# Patient Record
Sex: Male | Born: 1955 | Race: Black or African American | Hispanic: No | Marital: Married | Smoking: Former smoker
Health system: Southern US, Community
[De-identification: ages and names within clinical notes are randomized; demographics above are authoritative.]

## PROBLEM LIST (undated history)

## (undated) DIAGNOSIS — E119 Type 2 diabetes mellitus without complications: Secondary | ICD-10-CM

## (undated) DIAGNOSIS — E7800 Pure hypercholesterolemia, unspecified: Secondary | ICD-10-CM

## (undated) DIAGNOSIS — M1990 Unspecified osteoarthritis, unspecified site: Secondary | ICD-10-CM

## (undated) DIAGNOSIS — R252 Cramp and spasm: Secondary | ICD-10-CM

## (undated) DIAGNOSIS — I5189 Other ill-defined heart diseases: Secondary | ICD-10-CM

## (undated) HISTORY — PX: FRACTURE SURGERY: SHX138

## (undated) HISTORY — PX: HERNIA REPAIR: SHX51

## (undated) HISTORY — DX: Other ill-defined heart diseases: I51.89

## (undated) HISTORY — PX: JOINT REPLACEMENT: SHX530

---

## 2007-05-31 ENCOUNTER — Ambulatory Visit: Payer: Self-pay | Admitting: General Surgery

## 2008-03-16 ENCOUNTER — Ambulatory Visit: Payer: Self-pay

## 2008-03-20 ENCOUNTER — Ambulatory Visit: Payer: Self-pay

## 2009-06-12 ENCOUNTER — Ambulatory Visit: Payer: Self-pay | Admitting: Unknown Physician Specialty

## 2009-06-19 ENCOUNTER — Inpatient Hospital Stay: Payer: Self-pay | Admitting: Unknown Physician Specialty

## 2009-07-11 ENCOUNTER — Encounter: Payer: Self-pay | Admitting: Unknown Physician Specialty

## 2009-07-16 ENCOUNTER — Encounter: Payer: Self-pay | Admitting: Unknown Physician Specialty

## 2010-01-28 ENCOUNTER — Emergency Department: Payer: Self-pay | Admitting: Emergency Medicine

## 2010-08-06 ENCOUNTER — Emergency Department: Payer: Self-pay

## 2015-04-15 ENCOUNTER — Emergency Department
Admission: EM | Admit: 2015-04-15 | Discharge: 2015-04-15 | Disposition: A | Discharge status: Home / Self Care | Payer: 59 | Attending: Emergency Medicine | Admitting: Emergency Medicine

## 2015-04-15 ENCOUNTER — Encounter: Payer: Self-pay | Admitting: Emergency Medicine

## 2015-04-15 DIAGNOSIS — Y998 Other external cause status: Secondary | ICD-10-CM | POA: Insufficient documentation

## 2015-04-15 DIAGNOSIS — W228XXA Striking against or struck by other objects, initial encounter: Secondary | ICD-10-CM | POA: Diagnosis not present

## 2015-04-15 DIAGNOSIS — Y9289 Other specified places as the place of occurrence of the external cause: Secondary | ICD-10-CM | POA: Insufficient documentation

## 2015-04-15 DIAGNOSIS — Y9301 Activity, walking, marching and hiking: Secondary | ICD-10-CM | POA: Insufficient documentation

## 2015-04-15 DIAGNOSIS — S0501XA Injury of conjunctiva and corneal abrasion without foreign body, right eye, initial encounter: Secondary | ICD-10-CM | POA: Diagnosis not present

## 2015-04-15 DIAGNOSIS — S0591XA Unspecified injury of right eye and orbit, initial encounter: Secondary | ICD-10-CM | POA: Diagnosis present

## 2015-04-15 HISTORY — DX: Pure hypercholesterolemia, unspecified: E78.00

## 2015-04-15 MED ORDER — EYE WASH OPHTH SOLN
OPHTHALMIC | Status: AC
  Filled 2015-04-15: qty 118

## 2015-04-15 MED ORDER — HYDROCODONE-ACETAMINOPHEN 5-325 MG PO TABS
1.0000 | ORAL_TABLET | Freq: Once | ORAL | Status: AC
  Administered 2015-04-15: 1 via ORAL
  Filled 2015-04-15: qty 1

## 2015-04-15 MED ORDER — FLUORESCEIN SODIUM 1 MG OP STRP
ORAL_STRIP | OPHTHALMIC | Status: AC
  Filled 2015-04-15: qty 1

## 2015-04-15 MED ORDER — TETRACAINE HCL 0.5 % OP SOLN
OPHTHALMIC | Status: AC
  Filled 2015-04-15: qty 2

## 2015-04-15 MED ORDER — SULFACETAMIDE SODIUM 10 % OP SOLN: 2.0000 [drp] | Freq: Four times a day (QID) | OPHTHALMIC | Status: DC

## 2015-04-15 MED ORDER — HYDROCODONE-ACETAMINOPHEN 5-325 MG PO TABS: 1.0000 | ORAL_TABLET | ORAL | Status: DC | PRN

## 2015-04-15 NOTE — Discharge Instructions (Signed)
Corneal Abrasion °The cornea is the clear covering at the front and center of the eye. When looking at the colored portion of the eye (iris), you are looking through the cornea. This very thin tissue is made up of many layers. The surface layer is a single layer of cells (corneal epithelium) and is one of the most sensitive tissues in the body. If a scratch or injury causes the corneal epithelium to come off, it is called a corneal abrasion. If the injury extends to the tissues below the epithelium, the condition is called a corneal ulcer. °CAUSES  °· Scratches. °· Trauma. °· Foreign body in the eye. °Some people have recurrences of abrasions in the area of the original injury even after it has healed (recurrent erosion syndrome). Recurrent erosion syndrome generally improves and goes away with time. °SYMPTOMS  °· Eye pain. °· Difficulty or inability to keep the injured eye open. °· The eye becomes very sensitive to light. °· Recurrent erosions tend to happen suddenly, first thing in the morning, usually after waking up and opening the eye. °DIAGNOSIS  °Your health care provider can diagnose a corneal abrasion during an eye exam. Dye is usually placed in the eye using a drop or a small paper strip moistened by your tears. When the eye is examined with a special light, the abrasion shows up clearly because of the dye. °TREATMENT  °· Small abrasions may be treated with antibiotic drops or ointment alone. °· A pressure patch may be put over the eye. If this is done, follow your doctor's instructions for when to remove the patch. Do not drive or use machines while the eye patch is on. Judging distances is hard to do with a patch on. °If the abrasion becomes infected and spreads to the deeper tissues of the cornea, a corneal ulcer can result. This is serious because it can cause corneal scarring. Corneal scars interfere with light passing through the cornea and cause a loss of vision in the involved eye. °HOME CARE  INSTRUCTIONS °· Use medicine or ointment as directed. Only take over-the-counter or prescription medicines for pain, discomfort, or fever as directed by your health care provider. °· Do not drive or operate machinery if your eye is patched. Your ability to judge distances is impaired. °· If your health care provider has given you a follow-up appointment, it is very important to keep that appointment. Not keeping the appointment could result in a severe eye infection or permanent loss of vision. If there is any problem keeping the appointment, let your health care provider know. °SEEK MEDICAL CARE IF:  °· You have pain, light sensitivity, and a scratchy feeling in one eye or both eyes. °· Your pressure patch keeps loosening up, and you can blink your eye under the patch after treatment. °· Any kind of discharge develops from the eye after treatment or if the lids stick together in the morning. °· You have the same symptoms in the morning as you did with the original abrasion days, weeks, or months after the abrasion healed. °MAKE SURE YOU:  °· Understand these instructions. °· Will watch your condition. °· Will get help right away if you are not doing well or get worse. °Document Released: 08/29/2000 Document Revised: 09/06/2013 Document Reviewed: 05/09/2013 °ExitCare® Patient Information ©2015 [employer]. This information is not intended to replace advice given to you by your health care provider. Make sure you discuss any questions you have with your health care provider. ° °

## 2015-04-15 NOTE — ED Notes (Signed)
States he was in in right eye with tree limb last pm  While walking the dog

## 2015-04-15 NOTE — ED Provider Notes (Signed)
[facility] Emergency Department Provider Note  ____________________________________________  Time seen: Approximately 3:19 PM  I have reviewed the triage vital signs and the nursing notes.   HISTORY  Chief Complaint Eye Pain    HPI James Finley is a 59 y.o. male presentsfor right eye pain. Patient states he was walking his dog last night when a branch hit him in the eye. His any change of vision rates pain as an 8/10.   Past Medical History  Diagnosis Date  . Hypercholesteremia unk    There are no active problems to display for this patient.   History reviewed. No pertinent past surgical history.  Current Outpatient Rx  Name  Route  Sig  Dispense  Refill  . HYDROcodone-acetaminophen (NORCO) 5-325 MG per tablet   Oral   Take 1-2 tablets by mouth every 4 (four) hours as needed for moderate pain.   15 tablet   0   . sulfacetamide (BLEPH-10) 10 % ophthalmic solution   Both Eyes   Place 2 drops into both eyes 4 (four) times daily.   5 mL   0     Allergies Review of patient's allergies indicates no known allergies.  No family history on file.  Social History History  Substance Use Topics  . Smoking status: Never Smoker   . Smokeless tobacco: Not on file  . Alcohol Use: No    Review of Systems Constitutional: No fever/chills Eyes: No visual changes. Positive right eye pain with redness. ENT: No sore throat. Cardiovascular: Denies chest pain. Respiratory: Denies shortness of breath. Gastrointestinal: No abdominal pain.  No nausea, no vomiting.  No diarrhea.  No constipation. Genitourinary: Negative for dysuria. Musculoskeletal: Negative for back pain. Skin: Negative for rash. Neurological: Negative for headaches, focal weakness or numbness.  10-point ROS otherwise negative.  ____________________________________________   PHYSICAL EXAM:  VITAL SIGNS: ED Triage Vitals  Enc Vitals Group     BP 04/15/15 1435 147/89 mmHg    Pulse Rate 04/15/15 1435 54     Resp 04/15/15 1435 18     Temp 04/15/15 1435 98.3 F (36.8 C)     Temp Source 04/15/15 1435 Oral     SpO2 04/15/15 1435 98 %     Weight 04/15/15 1435 180 lb (81.647 kg)     Height 04/15/15 1435  (1.854 m)     Head Cir --      Peak Flow --      Pain Score 04/15/15 1436 3     Pain Loc --      Pain Edu? --      Excl. in GC? --     Constitutional: Alert and oriented. Well appearing and in no acute distress. Eyes: Conjunctivae are normal. PERRL. EOMI. Head: Atraumatic. Nose: No congestion/rhinnorhea. Mouth/Throat: Mucous membranes are moist.  Oropharynx non-erythematous. Neck: No stridor.   Cardiovascular: Normal rate, regular rhythm. Grossly normal heart sounds.  Good peripheral circulation. Respiratory: Normal respiratory effort.  No retractions. Lungs CTAB. Gastrointestinal: Soft and nontender. No distention. No abdominal bruits. No CVA tenderness. Musculoskeletal: No lower extremity tenderness nor edema.  No joint effusions. Neurologic:  Normal speech and language. No gross focal neurologic deficits are appreciated. No gait instability. Skin:  Skin is warm, dry and intact. No rash noted. Psychiatric: Mood and affect are normal. Speech and behavior are normal.  ____________________________________________   LABS (all labs ordered are listed, but only abnormal results are displayed)  Labs Reviewed - No data to display ____________________________________________  PROCEDURES  Procedure(s) performed:  Tetracaine eyedrops applied to the right eye. Fluorescein strip applied positive fluorescein uptake noted at 6:00. I copiously irrigated with 20 cc of normal saline.   Critical Care performed: No  ____________________________________________   INITIAL IMPRESSION / ASSESSMENT AND PLAN / ED COURSE  Pertinent labs & imaging results that were available during my care of the patient were reviewed by me and considered in my medical  decision making (see chart for details).  Corneal abrasion right eye. Rx given for sodium Sulamyd eyedrops. Hydrocodone as needed for pain patient follow-up with ophthalmology or return to the ER as needed. Patient denies any other emergency medical complaints at this time. ____________________________________________   FINAL CLINICAL IMPRESSION(S) / ED DIAGNOSES  Final diagnoses:  Corneal abrasion, right, initial encounter      Evangeline Dakin, PA-C 04/15/15 1613  Darien Ramus, MD 04/15/15 [phone]

## 2015-07-06 ENCOUNTER — Other Ambulatory Visit: Payer: Self-pay | Admitting: Orthopedic Surgery

## 2015-07-06 DIAGNOSIS — M1711 Unilateral primary osteoarthritis, right knee: Secondary | ICD-10-CM

## 2015-07-06 DIAGNOSIS — M1712 Unilateral primary osteoarthritis, left knee: Secondary | ICD-10-CM

## 2015-07-12 ENCOUNTER — Ambulatory Visit
Admission: RE | Admit: 2015-07-12 | Discharge: 2015-07-12 | Disposition: A | Discharge status: Home / Self Care | Payer: 59 | Source: Ambulatory Visit | Attending: Orthopedic Surgery | Admitting: Orthopedic Surgery

## 2015-07-12 DIAGNOSIS — M1712 Unilateral primary osteoarthritis, left knee: Secondary | ICD-10-CM | POA: Diagnosis not present

## 2015-07-12 DIAGNOSIS — M1711 Unilateral primary osteoarthritis, right knee: Secondary | ICD-10-CM

## 2015-07-25 ENCOUNTER — Encounter
Admission: RE | Admit: 2015-07-25 | Discharge: 2015-07-25 | Disposition: A | Discharge status: Home / Self Care | Payer: 59 | Source: Ambulatory Visit | Attending: Orthopedic Surgery | Admitting: Orthopedic Surgery

## 2015-07-25 ENCOUNTER — Other Ambulatory Visit: Payer: Self-pay

## 2015-07-25 DIAGNOSIS — Z01810 Encounter for preprocedural cardiovascular examination: Secondary | ICD-10-CM | POA: Diagnosis present

## 2015-07-25 DIAGNOSIS — Z01812 Encounter for preprocedural laboratory examination: Secondary | ICD-10-CM | POA: Insufficient documentation

## 2015-07-25 HISTORY — DX: Type 2 diabetes mellitus without complications: E11.9

## 2015-07-25 HISTORY — DX: Unspecified osteoarthritis, unspecified site: M19.90

## 2015-07-25 LAB — BASIC METABOLIC PANEL
ANION GAP: 6 (ref 5–15)
BUN: 16 mg/dL (ref 6–20)
CALCIUM: 9.1 mg/dL (ref 8.9–10.3)
CO2: 29 mmol/L (ref 22–32)
Chloride: 105 mmol/L (ref 101–111)
Creatinine, Ser: 0.59 mg/dL — ABNORMAL LOW (ref 0.61–1.24)
GFR calc Af Amer: >60 mL/min (ref >60)
GFR calc non Af Amer: >60 mL/min (ref >60)
GLUCOSE: 116 mg/dL — AB (ref 65–99)
Potassium: 3.9 mmol/L (ref 3.5–5.1)
SODIUM: 140 mmol/L (ref 135–145)

## 2015-07-25 LAB — TYPE AND SCREEN
ABO/RH(D): O POS
Antibody Screen: NEGATIVE

## 2015-07-25 LAB — CBC
HCT: 39.7 % — ABNORMAL LOW (ref 40.0–52.0)
HEMOGLOBIN: 12.9 g/dL — AB (ref 13.0–18.0)
MCH: 28.9 pg (ref 26.0–34.0)
MCHC: 32.4 g/dL (ref 32.0–36.0)
MCV: 89.1 fL (ref 80.0–100.0)
PLATELETS: 191 10*3/uL (ref 150–440)
RBC: 4.45 MIL/uL (ref 4.40–5.90)
RDW: 13.2 % (ref 11.5–14.5)
WBC: 5.2 10*3/uL (ref 3.8–10.6)

## 2015-07-25 LAB — URINALYSIS COMPLETE WITH MICROSCOPIC (ARMC ONLY)
BACTERIA UA: NONE SEEN
Bilirubin Urine: NEGATIVE
GLUCOSE, UA: NEGATIVE mg/dL
Hgb urine dipstick: NEGATIVE
KETONES UR: NEGATIVE mg/dL
LEUKOCYTES UA: NEGATIVE
NITRITE: NEGATIVE
PROTEIN: NEGATIVE mg/dL
RBC / HPF: NONE SEEN RBC/hpf (ref 0–5)
SPECIFIC GRAVITY, URINE: 1.019 (ref 1.005–1.030)
Squamous Epithelial / LPF: NONE SEEN
pH: 7 (ref 5.0–8.0)

## 2015-07-25 LAB — APTT: aPTT: 35 seconds (ref 24–36)

## 2015-07-25 LAB — ABO/RH: ABO/RH(D): O POS

## 2015-07-25 LAB — PROTIME-INR
INR: 1.08
Prothrombin Time: 14.2 seconds (ref 11.4–15.0)

## 2015-07-25 LAB — SEDIMENTATION RATE: Sed Rate: 3 mm/hr (ref 0–20)

## 2015-07-25 NOTE — Patient Instructions (Signed)
  Your procedure is scheduled on: Tuesday 08/07/2015 Report to Day Surgery. 2ND FLOOR MEDICAL MALL ENTRANCE To find out your arrival time please call [phone] between 1PM - 3PM on Monday 08/06/2015.  Remember: Instructions that are not followed completely may result in serious medical risk, up to and including death, or upon the discretion of your surgeon and anesthesiologist your surgery may need to be rescheduled.    __X__ 1. Do not eat food or drink liquids after midnight. No gum chewing or hard candies.     __X__ 2. No Alcohol for 24 hours before or after surgery.   ____ 3. Bring all medications with you on the day of surgery if instructed.    __X__ 4. Notify your doctor if there is any change in your medical condition     (cold, fever, infections).     Do not wear jewelry, make-up, hairpins, clips or nail polish.  Do not wear lotions, powders, or perfumes.   Do not shave 48 hours prior to surgery. Men may shave face and neck.  Do not bring valuables to the hospital.    [facility] any belongings or valuables.               Contacts, dentures or bridgework may not be worn into surgery.  Leave your suitcase in the car. After surgery it may be brought to your room.  For patients admitted to the hospital, discharge time is determined by your                treatment team.   Patients discharged the day of surgery will not be allowed to drive home.   Please read over the following fact sheets that you were given:   MRSA Information and Surgical Site Infection Prevention   ____ Take these medicines the morning of surgery with A SIP OF WATER:    1.   2.   3.   4.  5.  6.  ____ Fleet Enema (as directed)   __X__ Use CHG Soap as directed  ____ Use inhalers on the day of surgery  ____ Stop metformin 2 days prior to surgery    ____ Take 1/2 of usual insulin dose the night before surgery and none on the morning of surgery.   ____ Stop  Coumadin/Plavix/aspirin on   __X__STOP MELOXICAM, NAPROXEN, AND OSTEO BIFLEX 1 WEEK BEFORE SURGERY   ____ Stop supplements until after surgery.    ____ Bring C-Pap to the hospital.

## 2015-07-27 LAB — URINE CULTURE: CULTURE: NO GROWTH

## 2015-08-03 ENCOUNTER — Encounter
Admission: RE | Admit: 2015-08-03 | Discharge: 2015-08-03 | Disposition: A | Discharge status: Home / Self Care | Payer: 59 | Source: Ambulatory Visit | Attending: Orthopedic Surgery | Admitting: Orthopedic Surgery

## 2015-08-03 ENCOUNTER — Other Ambulatory Visit: Payer: 59

## 2015-08-03 DIAGNOSIS — M1712 Unilateral primary osteoarthritis, left knee: Secondary | ICD-10-CM | POA: Diagnosis present

## 2015-08-03 DIAGNOSIS — Z01812 Encounter for preprocedural laboratory examination: Secondary | ICD-10-CM | POA: Diagnosis present

## 2015-08-03 DIAGNOSIS — E785 Hyperlipidemia, unspecified: Secondary | ICD-10-CM | POA: Diagnosis not present

## 2015-08-03 DIAGNOSIS — E119 Type 2 diabetes mellitus without complications: Secondary | ICD-10-CM | POA: Insufficient documentation

## 2015-08-03 LAB — SURGICAL PCR SCREEN
MRSA, PCR: NEGATIVE
STAPHYLOCOCCUS AUREUS: NEGATIVE

## 2015-08-07 ENCOUNTER — Inpatient Hospital Stay: Payer: 59 | Admitting: Certified Registered"

## 2015-08-07 ENCOUNTER — Inpatient Hospital Stay: Payer: 59

## 2015-08-07 ENCOUNTER — Encounter: Payer: Self-pay | Admitting: *Deleted

## 2015-08-07 ENCOUNTER — Inpatient Hospital Stay
Admission: RE | Admit: 2015-08-07 | Discharge: 2015-08-10 | DRG: 470 | Disposition: A | Discharge status: Home / Self Care | Payer: 59 | Source: Ambulatory Visit | Attending: Internal Medicine | Admitting: Internal Medicine

## 2015-08-07 ENCOUNTER — Encounter
Admission: RE | Disposition: A | Discharge status: Home / Self Care | Payer: Self-pay | Source: Ambulatory Visit | Attending: Orthopedic Surgery

## 2015-08-07 DIAGNOSIS — G8918 Other acute postprocedural pain: Secondary | ICD-10-CM

## 2015-08-07 DIAGNOSIS — E7800 Pure hypercholesterolemia, unspecified: Secondary | ICD-10-CM | POA: Diagnosis present

## 2015-08-07 DIAGNOSIS — Z96642 Presence of left artificial hip joint: Secondary | ICD-10-CM | POA: Diagnosis present

## 2015-08-07 DIAGNOSIS — Z886 Allergy status to analgesic agent status: Secondary | ICD-10-CM

## 2015-08-07 DIAGNOSIS — Z87891 Personal history of nicotine dependence: Secondary | ICD-10-CM

## 2015-08-07 DIAGNOSIS — E785 Hyperlipidemia, unspecified: Secondary | ICD-10-CM | POA: Diagnosis present

## 2015-08-07 DIAGNOSIS — R609 Edema, unspecified: Secondary | ICD-10-CM

## 2015-08-07 DIAGNOSIS — R000 Tachycardia, unspecified: Secondary | ICD-10-CM | POA: Diagnosis present

## 2015-08-07 DIAGNOSIS — M1712 Unilateral primary osteoarthritis, left knee: Secondary | ICD-10-CM | POA: Diagnosis present

## 2015-08-07 DIAGNOSIS — E119 Type 2 diabetes mellitus without complications: Secondary | ICD-10-CM | POA: Diagnosis present

## 2015-08-07 DIAGNOSIS — O2230 Deep phlebothrombosis in pregnancy, unspecified trimester: Secondary | ICD-10-CM

## 2015-08-07 HISTORY — PX: TOTAL KNEE ARTHROPLASTY: SHX125

## 2015-08-07 LAB — GLUCOSE, CAPILLARY
Glucose-Capillary: 136 mg/dL — ABNORMAL HIGH (ref 65–99)
Glucose-Capillary: 111 mg/dL — ABNORMAL HIGH (ref 65–99)

## 2015-08-07 LAB — CBC
HCT: 37.8 % — ABNORMAL LOW (ref 40.0–52.0)
Hemoglobin: 12.5 g/dL — ABNORMAL LOW (ref 13.0–18.0)
MCH: 29.7 pg (ref 26.0–34.0)
MCHC: 33.1 g/dL (ref 32.0–36.0)
MCV: 89.7 fL (ref 80.0–100.0)
PLATELETS: 177 10*3/uL (ref 150–440)
RBC: 4.21 MIL/uL — AB (ref 4.40–5.90)
RDW: 13 % (ref 11.5–14.5)
WBC: 6.1 10*3/uL (ref 3.8–10.6)

## 2015-08-07 LAB — CREATININE, SERUM: Creatinine, Ser: 0.65 mg/dL (ref 0.61–1.24)

## 2015-08-07 SURGERY — ARTHROPLASTY, KNEE, TOTAL
Anesthesia: General | Site: Knee | Laterality: Left | Wound class: Clean

## 2015-08-07 MED ORDER — MORPHINE SULFATE (PF) 2 MG/ML IV SOLN
2.0000 mg | INTRAVENOUS | Status: DC | PRN
  Administered 2015-08-07 – 2015-08-08 (×6): 2 mg via INTRAVENOUS
  Filled 2015-08-07 (×6): qty 1

## 2015-08-07 MED ORDER — DOCUSATE SODIUM 100 MG PO CAPS
100.0000 mg | ORAL_CAPSULE | Freq: Two times a day (BID) | ORAL | Status: DC
  Administered 2015-08-07 – 2015-08-10 (×6): 100 mg via ORAL
  Filled 2015-08-07 (×7): qty 1

## 2015-08-07 MED ORDER — BUPIVACAINE HCL (PF) 0.5 % IJ SOLN
INTRAMUSCULAR | Status: DC | PRN
  Administered 2015-08-07: 3 mL

## 2015-08-07 MED ORDER — LIDOCAINE HCL (CARDIAC) 20 MG/ML IV SOLN
INTRAVENOUS | Status: DC | PRN
  Administered 2015-08-07: 40 mg via INTRAVENOUS

## 2015-08-07 MED ORDER — CEFAZOLIN SODIUM-DEXTROSE 2-3 GM-% IV SOLR
2.0000 g | Freq: Once | INTRAVENOUS | Status: AC
  Administered 2015-08-07: 2 g via INTRAVENOUS

## 2015-08-07 MED ORDER — NEOMYCIN-POLYMYXIN B GU 40-200000 IR SOLN
Status: DC | PRN
  Administered 2015-08-07: 16 mL

## 2015-08-07 MED ORDER — SODIUM CHLORIDE 0.9 % IV SOLN
INTRAVENOUS | Status: DC
  Administered 2015-08-07: 07:00:00 via INTRAVENOUS

## 2015-08-07 MED ORDER — PROMETHAZINE HCL 25 MG/ML IJ SOLN: 6.2500 mg | INTRAMUSCULAR | Status: DC | PRN

## 2015-08-07 MED ORDER — TRANEXAMIC ACID 1000 MG/10ML IV SOLN
1000.0000 mg | INTRAVENOUS | Status: AC
Start: 2015-08-07 — End: 2015-08-07
  Administered 2015-08-07: 1000 mg via INTRAVENOUS
  Filled 2015-08-07: qty 10

## 2015-08-07 MED ORDER — MAGNESIUM HYDROXIDE 400 MG/5ML PO SUSP
30.0000 mL | Freq: Every day | ORAL | Status: DC | PRN
  Administered 2015-08-08 – 2015-08-09 (×2): 30 mL via ORAL
  Filled 2015-08-07 (×2): qty 30

## 2015-08-07 MED ORDER — METOCLOPRAMIDE HCL 5 MG PO TABS
5.0000 mg | ORAL_TABLET | Freq: Three times a day (TID) | ORAL | Status: DC | PRN
  Administered 2015-08-09: 10 mg via ORAL
  Filled 2015-08-07: qty 2

## 2015-08-07 MED ORDER — PRAVASTATIN SODIUM 20 MG PO TABS
20.0000 mg | ORAL_TABLET | Freq: Every day | ORAL | Status: DC
  Administered 2015-08-07 – 2015-08-09 (×3): 20 mg via ORAL
  Filled 2015-08-07 (×4): qty 1

## 2015-08-07 MED ORDER — MENTHOL 3 MG MT LOZG: 1.0000 | LOZENGE | OROMUCOSAL | Status: DC | PRN

## 2015-08-07 MED ORDER — BUPIVACAINE-EPINEPHRINE (PF) 0.25% -1:200000 IJ SOLN
INTRAMUSCULAR | Status: DC | PRN
Start: 2015-08-07 — End: 2015-08-07
  Administered 2015-08-07: 30 mL via PERINEURAL

## 2015-08-07 MED ORDER — FENTANYL CITRATE (PF) 100 MCG/2ML IJ SOLN
INTRAMUSCULAR | Status: DC | PRN
  Administered 2015-08-07: 25 ug via INTRAVENOUS

## 2015-08-07 MED ORDER — ACETAMINOPHEN 650 MG RE SUPP: 650.0000 mg | Freq: Four times a day (QID) | RECTAL | Status: DC | PRN

## 2015-08-07 MED ORDER — PHENYLEPHRINE HCL 10 MG/ML IJ SOLN
INTRAMUSCULAR | Status: DC | PRN
  Administered 2015-08-07 (×2): 100 ug via INTRAVENOUS

## 2015-08-07 MED ORDER — SODIUM CHLORIDE 0.9 % IV SOLN
INTRAVENOUS | Status: DC
  Administered 2015-08-07 – 2015-08-08 (×3): via INTRAVENOUS

## 2015-08-07 MED ORDER — ACETAMINOPHEN 325 MG PO TABS: 650.0000 mg | ORAL_TABLET | Freq: Four times a day (QID) | ORAL | Status: DC | PRN

## 2015-08-07 MED ORDER — ONDANSETRON HCL 4 MG/2ML IJ SOLN
INTRAMUSCULAR | Status: DC | PRN
  Administered 2015-08-07: 4 mg via INTRAVENOUS

## 2015-08-07 MED ORDER — BUPIVACAINE-EPINEPHRINE (PF) 0.25% -1:200000 IJ SOLN
INTRAMUSCULAR | Status: AC
  Filled 2015-08-07: qty 30

## 2015-08-07 MED ORDER — ONDANSETRON HCL 4 MG/2ML IJ SOLN
4.0000 mg | Freq: Four times a day (QID) | INTRAMUSCULAR | Status: DC | PRN
  Administered 2015-08-07 – 2015-08-09 (×3): 4 mg via INTRAVENOUS
  Filled 2015-08-07 (×4): qty 2

## 2015-08-07 MED ORDER — KETOROLAC TROMETHAMINE 30 MG/ML IJ SOLN
INTRAMUSCULAR | Status: DC | PRN
  Administered 2015-08-07: 30 mg

## 2015-08-07 MED ORDER — CEFAZOLIN SODIUM-DEXTROSE 2-3 GM-% IV SOLR
2.0000 g | Freq: Four times a day (QID) | INTRAVENOUS | Status: AC
  Administered 2015-08-07 (×3): 2 g via INTRAVENOUS
  Filled 2015-08-07 (×3): qty 50

## 2015-08-07 MED ORDER — ONDANSETRON HCL 4 MG PO TABS: 4.0000 mg | ORAL_TABLET | Freq: Four times a day (QID) | ORAL | Status: DC | PRN

## 2015-08-07 MED ORDER — PROPOFOL 10 MG/ML IV BOLUS
INTRAVENOUS | Status: DC | PRN
  Administered 2015-08-07: 30 mg via INTRAVENOUS

## 2015-08-07 MED ORDER — ZOLPIDEM TARTRATE 5 MG PO TABS: 5.0000 mg | ORAL_TABLET | Freq: Every evening | ORAL | Status: DC | PRN

## 2015-08-07 MED ORDER — PROPOFOL 500 MG/50ML IV EMUL
INTRAVENOUS | Status: DC | PRN
  Administered 2015-08-07: 120 ug/kg/min via INTRAVENOUS

## 2015-08-07 MED ORDER — MORPHINE SULFATE (PF) 10 MG/ML IV SOLN
INTRAVENOUS | Status: AC
  Filled 2015-08-07: qty 1

## 2015-08-07 MED ORDER — NEOMYCIN-POLYMYXIN B GU 40-200000 IR SOLN
Status: AC
  Filled 2015-08-07: qty 16

## 2015-08-07 MED ORDER — SODIUM CHLORIDE 0.9 % IJ SOLN
INTRAMUSCULAR | Status: AC
  Filled 2015-08-07: qty 100

## 2015-08-07 MED ORDER — MORPHINE SULFATE 10 MG/ML IJ SOLN
INTRAMUSCULAR | Status: DC | PRN
  Administered 2015-08-07: 10 mg

## 2015-08-07 MED ORDER — MIDAZOLAM HCL 5 MG/5ML IJ SOLN
INTRAMUSCULAR | Status: DC | PRN
  Administered 2015-08-07: 2 mg via INTRAVENOUS

## 2015-08-07 MED ORDER — PHENOL 1.4 % MT LIQD: 1.0000 | OROMUCOSAL | Status: DC | PRN

## 2015-08-07 MED ORDER — ENOXAPARIN SODIUM 30 MG/0.3ML ~~LOC~~ SOLN
30.0000 mg | Freq: Two times a day (BID) | SUBCUTANEOUS | Status: DC
  Administered 2015-08-08 – 2015-08-10 (×5): 30 mg via SUBCUTANEOUS
  Filled 2015-08-07 (×5): qty 0.3

## 2015-08-07 MED ORDER — MAGNESIUM CITRATE PO SOLN
1.0000 | Freq: Once | ORAL | Status: AC | PRN
  Administered 2015-08-09: 1 via ORAL
  Filled 2015-08-07 (×2): qty 296

## 2015-08-07 MED ORDER — FAMOTIDINE 20 MG PO TABS
ORAL_TABLET | ORAL | Status: AC
  Administered 2015-08-07: 20 mg via ORAL
  Filled 2015-08-07: qty 1

## 2015-08-07 MED ORDER — FENTANYL CITRATE (PF) 100 MCG/2ML IJ SOLN: 25.0000 ug | INTRAMUSCULAR | Status: DC | PRN

## 2015-08-07 MED ORDER — METOCLOPRAMIDE HCL 5 MG/ML IJ SOLN
5.0000 mg | Freq: Three times a day (TID) | INTRAMUSCULAR | Status: DC | PRN
  Administered 2015-08-09: 10 mg via INTRAVENOUS
  Filled 2015-08-07: qty 2

## 2015-08-07 MED ORDER — OXYCODONE HCL 5 MG PO TABS
5.0000 mg | ORAL_TABLET | ORAL | Status: DC | PRN
  Administered 2015-08-07 (×2): 5 mg via ORAL
  Administered 2015-08-07 – 2015-08-08 (×3): 10 mg via ORAL
  Administered 2015-08-08: 5 mg via ORAL
  Administered 2015-08-08 (×3): 10 mg via ORAL
  Administered 2015-08-08: 5 mg via ORAL
  Administered 2015-08-08: 10 mg via ORAL
  Administered 2015-08-08 – 2015-08-09 (×2): 5 mg via ORAL
  Filled 2015-08-07: qty 2
  Filled 2015-08-07: qty 1
  Filled 2015-08-07 (×2): qty 2
  Filled 2015-08-07: qty 1
  Filled 2015-08-07: qty 2
  Filled 2015-08-07: qty 1
  Filled 2015-08-07 (×2): qty 2
  Filled 2015-08-07 (×2): qty 1
  Filled 2015-08-07: qty 2
  Filled 2015-08-07 (×2): qty 1

## 2015-08-07 MED ORDER — BISACODYL 10 MG RE SUPP
10.0000 mg | Freq: Every day | RECTAL | Status: DC | PRN
  Administered 2015-08-09: 10 mg via RECTAL

## 2015-08-07 MED ORDER — METHOCARBAMOL 500 MG PO TABS: 500.0000 mg | ORAL_TABLET | Freq: Four times a day (QID) | ORAL | Status: DC | PRN

## 2015-08-07 MED ORDER — SODIUM CHLORIDE 0.9 % IV SOLN
INTRAVENOUS | Status: DC | PRN
  Administered 2015-08-07: 60 mL

## 2015-08-07 MED ORDER — ARTIFICIAL TEARS OP OINT
TOPICAL_OINTMENT | Freq: Every evening | OPHTHALMIC | Status: DC | PRN
  Filled 2015-08-07: qty 3.5

## 2015-08-07 MED ORDER — FAMOTIDINE 20 MG PO TABS
20.0000 mg | ORAL_TABLET | Freq: Once | ORAL | Status: AC
  Administered 2015-08-07: 20 mg via ORAL

## 2015-08-07 MED ORDER — METHOCARBAMOL 1000 MG/10ML IJ SOLN
500.0000 mg | Freq: Four times a day (QID) | INTRAVENOUS | Status: DC | PRN
  Filled 2015-08-07: qty 5

## 2015-08-07 MED ORDER — BUPIVACAINE LIPOSOME 1.3 % IJ SUSP
INTRAMUSCULAR | Status: AC
  Filled 2015-08-07: qty 20

## 2015-08-07 MED ORDER — CEFAZOLIN SODIUM-DEXTROSE 2-3 GM-% IV SOLR
INTRAVENOUS | Status: AC
  Filled 2015-08-07: qty 50

## 2015-08-07 SURGICAL SUPPLY — 53 items
BANDAGE ACE 6X5 VEL STRL LF (GAUZE/BANDAGES/DRESSINGS) ×2 IMPLANT
BLADE SAW 1 (BLADE) ×2 IMPLANT
BLOCK CUTTING FEMUR 4 LT MED (MISCELLANEOUS) IMPLANT
BLOCK CUTTING TIBIAL 5 LT (MISCELLANEOUS) IMPLANT
CANISTER SUCT 1200ML W/VALVE (MISCELLANEOUS) ×2 IMPLANT
CANISTER SUCT 3000ML (MISCELLANEOUS) ×4 IMPLANT
CAPT KNEE TOTAL 3 ×2 IMPLANT
CATH FOL LEG HOLDER (MISCELLANEOUS) ×2 IMPLANT
CATH TRAY METER 16FR LF (MISCELLANEOUS) ×2 IMPLANT
CEMENT HV SMART SET (Cement) ×4 IMPLANT
CHLORAPREP W/TINT 26ML (MISCELLANEOUS) ×2 IMPLANT
COOLER POLAR GLACIER W/PUMP (MISCELLANEOUS) ×2 IMPLANT
DRAPE INCISE IOBAN 66X45 STRL (DRAPES) ×4 IMPLANT
DRAPE SHEET LG 3/4 BI-LAMINATE (DRAPES) ×4 IMPLANT
ELECT CAUTERY BLADE 6.4 (BLADE) ×2 IMPLANT
GAUZE PETRO XEROFOAM 1X8 (MISCELLANEOUS) ×2 IMPLANT
GAUZE SPONGE 4X4 12PLY STRL (GAUZE/BANDAGES/DRESSINGS) ×2 IMPLANT
GLOVE BIOGEL PI IND STRL 9 (GLOVE) ×1 IMPLANT
GLOVE BIOGEL PI INDICATOR 9 (GLOVE) ×1
GLOVE SURG ORTHO 9.0 STRL STRW (GLOVE) ×2 IMPLANT
GOWN SPECIALTY ULTRA XL (MISCELLANEOUS) ×2 IMPLANT
GOWN STRL REUS W/ TWL LRG LVL3 (GOWN DISPOSABLE) ×2 IMPLANT
GOWN STRL REUS W/TWL LRG LVL3 (GOWN DISPOSABLE) ×2
HANDPIECE SUCTION TUBG SURGILV (MISCELLANEOUS) ×2 IMPLANT
HOOD PEEL AWAY FACE SHEILD DIS (HOOD) ×4 IMPLANT
IMMBOLIZER KNEE 19 BLUE UNIV (SOFTGOODS) ×2 IMPLANT
IV SET EXTENSION 6 LL TADAPT (SET/KITS/TRAYS/PACK) ×2 IMPLANT
KNEE MEDACTA TIBIAL/FEMORAL BL (Knees) ×2 IMPLANT
KNIFE SCULPS 14X20 (INSTRUMENTS) ×2 IMPLANT
Medacta IMPLANT
NDL SAFETY 18GX1.5 (NEEDLE) ×2 IMPLANT
NEEDLE SPNL 18GX3.5 QUINCKE PK (NEEDLE) ×2 IMPLANT
NEEDLE SPNL 20GX3.5 QUINCKE YW (NEEDLE) ×2 IMPLANT
NS IRRIG 1000ML POUR BTL (IV SOLUTION) ×2 IMPLANT
PACK TOTAL KNEE (MISCELLANEOUS) ×2 IMPLANT
PAD GROUND ADULT SPLIT (MISCELLANEOUS) ×2 IMPLANT
PAD WRAPON POLAR KNEE (MISCELLANEOUS) ×1 IMPLANT
SOL .9 NS 3000ML IRR  AL (IV SOLUTION) ×1
SOL .9 NS 3000ML IRR UROMATIC (IV SOLUTION) ×1 IMPLANT
STAPLER SKIN PROX 35W (STAPLE) ×2 IMPLANT
STEM EXTENSION 11MMX30MM (Stem) ×2 IMPLANT
STRAP SAFETY BODY (MISCELLANEOUS) ×2 IMPLANT
SUCTION FRAZIER TIP 10 FR DISP (SUCTIONS) ×2 IMPLANT
SUT DVC 2 QUILL PDO  T11 36X36 (SUTURE) ×1
SUT DVC 2 QUILL PDO T11 36X36 (SUTURE) ×1 IMPLANT
SUT DVC QUILL MONODERM 30X30 (SUTURE) ×2 IMPLANT
SUT ETHIBOND NAB CT1 #1 30IN (SUTURE) ×2 IMPLANT
SYR 20CC LL (SYRINGE) ×2 IMPLANT
SYR 50ML LL SCALE MARK (SYRINGE) ×2 IMPLANT
TIBIAL BONE MODEL LEFT (MISCELLANEOUS) IMPLANT
TOWER CARTRIDGE SMART MIX (DISPOSABLE) ×2 IMPLANT
WATER STERILE IRR 1000ML POUR (IV SOLUTION) IMPLANT
WRAPON POLAR PAD KNEE (MISCELLANEOUS) ×2

## 2015-08-07 NOTE — Evaluation (Signed)
Physical Therapy Evaluation Patient Details Name: James Finley MRN: [id number] DOB: 1956/03/23 Today's Date: 08/07/2015   History of Present Illness  Pt presents with L TKR on 08/07/15.  Clinical Impression  Pt is a pleasant 59 year old male who was admitted for L THR. Pt performs bed mobility/transfers with mod I and ambulation with supervision and rw. Pt demonstrates deficits with strength/pain/mobility. Pt is very motivated to perform therapy this date. Does not require KI at this time. Would benefit from skilled PT to address above deficits and promote optimal return to PLOF.      Follow Up Recommendations Home health PT    Equipment Recommendations  Rolling walker with 5" wheels    Recommendations for Other Services       Precautions / Restrictions Precautions Precautions: Knee Precaution Booklet Issued: No Precaution Comments: can do 10 SLR, does not require KI Restrictions Weight Bearing Restrictions: Yes LLE Weight Bearing: Weight bearing as tolerated      Mobility  Bed Mobility Overal bed mobility: Modified Independent             General bed mobility comments: safe technique performed. Slightly impulsive, needs cues for safety  Transfers Overall transfer level: Modified independent Equipment used: Rolling walker (2 wheeled)             General transfer comment: sit<>Stand with rw. RW too short at this time, needs to be adjusted once more rw are available.   Ambulation/Gait Ambulation/Gait assistance: Supervision Ambulation Distance (Feet): 10 Feet Assistive device: Rolling walker (2 wheeled) Gait Pattern/deviations: Step-to pattern     General Gait Details: ambulated using rw and cga. Safe technique performed with cues given for sequencing  Stairs            Wheelchair Mobility    Modified Rankin (Stroke Patients Only)       Balance Overall balance assessment: Modified Independent                                            Pertinent Vitals/Pain Pain Assessment: No/denies pain    Home Living Family/patient expects to be discharged to:: Private residence Living Arrangements: Spouse/significant other Available Help at Discharge: Family Type of Home: House Home Access: Stairs to enter Entrance Stairs-Rails: Can reach both Entrance Stairs-Number of Steps: 10 Home Layout: One level Home Equipment: None      Prior Function Level of Independence: Independent               Hand Dominance        Extremity/Trunk Assessment   Upper Extremity Assessment: Overall WFL for tasks assessed           Lower Extremity Assessment: LLE deficits/detail   LLE Deficits / Details: grossly 3/5     Communication   Communication: No difficulties  Cognition   Behavior During Therapy: WFL for tasks assessed/performed Overall Cognitive Status: Within Functional Limits for tasks assessed                      General Comments      Exercises Total Joint Exercises Goniometric ROM: 15-80 degrees on L LE Other Exercises Other Exercises: pt performed supine ther-ex including L LE ankle pumps, quad sets, SLRs, and glut squeezes x 10 reps with supervision and cues for technique.      Assessment/Plan    PT Assessment Patient needs  continued PT services  PT Diagnosis Difficulty walking;Acute pain   PT Problem List Decreased strength;Decreased balance;Decreased mobility;Pain  PT Treatment Interventions Gait training;Therapeutic exercise   PT Goals (Current goals can be found in the Care Plan section) Acute Rehab PT Goals Patient Stated Goal: to go home PT Goal Formulation: With patient Time For Goal Achievement: 08/21/15 Potential to Achieve Goals: Good    Frequency BID   Barriers to discharge        Co-evaluation               End of Session Equipment Utilized During Treatment: Gait belt Activity Tolerance: Patient tolerated treatment well Patient left: in chair  (no alarm present) Nurse Communication: Mobility status         Time: 1643-1700 PT Time Calculation (min) (ACUTE ONLY): 17 min   Charges:   PT Evaluation $Initial PT Evaluation Tier I: 1 Procedure PT Treatments $Therapeutic Exercise: 8-22 mins   PT G Codes:        Afifa Truax 08/07/2015, 5:09 PM  Elizabeth PalauStephanie Korby Ratay, PT, DPT [phone]

## 2015-08-07 NOTE — Transfer of Care (Signed)
Immediate Anesthesia Transfer of Care Note  Patient: James Finley Ingram  Procedure(s) Performed: Procedure(s): TOTAL KNEE ARTHROPLASTY (Left)  Patient Location: PACU  Anesthesia Type:General and Spinal  Level of Consciousness: awake, alert , oriented and patient cooperative  Airway & Oxygen Therapy: Patient Spontanous Breathing and Patient connected to face mask oxygen  Post-op Assessment: Report given to RN, Post -op Vital signs reviewed and stable and Patient moving all extremities X 4  Post vital signs: Reviewed and stable  Last Vitals:  Filed Vitals:   08/07/15 0608  BP: 155/97  Pulse: 60  Temp: 36.8 C  Resp: 16    Complications: No apparent anesthesia complications

## 2015-08-07 NOTE — H&P (Signed)
Reviewed paper H+P, will be scanned into chart. No changes noted.  

## 2015-08-07 NOTE — Anesthesia Procedure Notes (Signed)
Spinal Patient location during procedure: OR Start time: 08/07/2015 7:23 AM End time: 08/07/2015 7:23 AM Staffing Anesthesiologist: Martha Clan Resident/CRNA: Takeru Bose, [employer] [employer] Performed by: resident/CRNA  Preanesthetic Checklist Completed: patient identified, site marked, surgical consent, pre-op evaluation, timeout performed, IV checked, risks and benefits discussed and monitors and equipment checked Spinal Block Patient position: sitting Prep: Betadine Patient monitoring: heart rate, continuous pulse ox, blood pressure and cardiac monitor Approach: midline Location: L4-5 Injection technique: single-shot Needle Needle type: Whitacre and Introducer  Needle gauge: 25 G Needle length: 9 cm Assessment Sensory level: T6 Additional Notes Negative paresthesia. Negative blood return. Positive free-flowing CSF. Expiration date of kit checked and confirmed. Patient tolerated procedure well, without complications.

## 2015-08-07 NOTE — Anesthesia Preprocedure Evaluation (Signed)
Anesthesia Evaluation  Patient identified by MRN, date of birth, ID band Patient awake    Reviewed: Allergy & Precautions, H&P , NPO status , Patient's Chart, lab work & pertinent test results, reviewed documented beta blocker date and time   History of Anesthesia Complications Negative for: history of anesthetic complications  Airway Mallampati: III  TM Distance: >3 FB Neck ROM: full    Dental  (+) Partial Upper, Missing, Poor Dentition   Pulmonary neg pulmonary ROS, former smoker,    Pulmonary exam normal breath sounds clear to auscultation       Cardiovascular Exercise Tolerance: Good negative cardio ROS Normal cardiovascular exam Rhythm:regular Rate:Normal     Neuro/Psych negative neurological ROS  negative psych ROS   GI/Hepatic negative GI ROS, Neg liver ROS,   Endo/Other  diabetes  Renal/GU negative Renal ROS  negative genitourinary   Musculoskeletal   Abdominal   Peds  Hematology negative hematology ROS (+)   Anesthesia Other Findings Past Medical History:   Hypercholesteremia                              unk          Arthritis                                                    Diabetes mellitus without complication (HCC)                   Comment:diet controlled   Reproductive/Obstetrics negative OB ROS                             Anesthesia Physical Anesthesia Plan  ASA: II  Anesthesia Plan: Spinal and MAC   Post-op Pain Management:    Induction:   Airway Management Planned:   Additional Equipment:   Intra-op Plan:   Post-operative Plan:   Informed Consent: I have reviewed the patients History and Physical, chart, labs and discussed the procedure including the risks, benefits and alternatives for the proposed anesthesia with the patient or authorized representative who has indicated his/her understanding and acceptance.   Dental Advisory Given  Plan  Discussed with: Anesthesiologist, CRNA and Surgeon  Anesthesia Plan Comments:         Anesthesia Quick Evaluation

## 2015-08-07 NOTE — Op Note (Signed)
08/07/2015  9:21 AM  PATIENT:  James Finley  59 y.o. male  PRE-OPERATIVE DIAGNOSIS:  PRIMARY OSTEOARTHRITIS left knee  POST-OPERATIVE DIAGNOSIS:  PRIMARY OSTEOARTHRITIS left knee  PROCEDURE:  Procedure(s): TOTAL KNEE ARTHROPLASTY (Left)  SURGEON: James SchullerMichael J Hridhaan Yohn, MD  ASSISTANTS: [place] [facility]ighbor[facility]ris Gaines [facility]  ANESTHESIA:   spinal  EBL:  Total I/O In: 800 [I.V.:800] Out: 350 [Urine:300; Blood:50]  BLOOD ADMINISTERED:none  DRAINS: none   LOCAL MEDICATIONS USED:  MARCAINE    and OTHER morphine Toradol and Exparel  SPECIMEN:  Source of Specimen:  Cut ends of bone left knee  DISPOSITION OF SPECIMEN:  PATHOLOGY  COUNTS:  YES  TOURNIQUET:   66 minutes at 300 mmHg  IMPLANTS: Medacta GMK sphere 4+ femur, 5 left tibia with short stem 10 mm insert and 4 patella, all components cemented  DICTATION: .Dragon Dictation patient was brought to the operating room and after adequate spinal anesthesia was obtained, the left leg was prepped and draped in sterile fashion. After patient identification timeout procedure were completed a midline skin incision was made with a tourniquet raised followed by medial parapatellar arthrotomy. Inspection revealed eburnated bone in the medial femoral and tibial condyles particularly anterior tibial condyle the medial facet of the trochlea and entire patella were worn down to the bone lateral compartment was relatively spared. Anterior cruciate ligament was essentially deficient with only a few strands remaining. Fat pad and anterior cruciate ligament were excised and proximal tibia exposed for education of the Medacta cutting guide. Medacta cutting guide applied and proximal tibia cut carried out. Next the distal femoral cut was carried out using Medacta guide after removing some residual cartilage. The PCL was excised at this point along the posterior horns of the menisci. The size 5 left tibia trial was placed and pinned in position with proximal preparation was reaming  and placement of the keel punch. The 4-in-1 cutting guide was applied to the distal femur anterior posterior and chamfer cuts made with no notching. The 4+ trial was placed and drill holes made with a 10 mm insert giving excellent stability. These trials were removed and the trochlear groove cut was carried out on the distal femur followed by preparation of the patella which measured a size 4 after 3 peg holes were made with a drill. At this point the knee was infiltrated with the above local medications and the bony surfaces thoroughly irrigated and dried. Tibial component component was cemented into place first followed by the femoral component and tibial insert with set screw using the torque limiting screwdriver. The patellar button was clamped into place as well with excess cement removed the knee was held in extension until the cement set and then the excess cement removed with use of an osteotome. Patella tracked well with no touch technique there is excellent stability the medial compartment and lateral side as well. The tourniquet is let down hemostasis checked electrocautery and the arthrotomy was repaired using a heavy Quill followed by 2-0 Quill subcutaneously and then skin staples. Xeroform 4 x 4's ABDs web roll and Ace wrap were applied patient center comes stable condition  PLAN OF CARE: Admit to inpatient   PATIENT DISPOSITION:  PACU - hemodynamically stable.

## 2015-08-08 LAB — CBC
HCT: 35.5 % — ABNORMAL LOW (ref 40.0–52.0)
Hemoglobin: 11.5 g/dL — ABNORMAL LOW (ref 13.0–18.0)
MCH: 29.1 pg (ref 26.0–34.0)
MCHC: 32.4 g/dL (ref 32.0–36.0)
MCV: 89.8 fL (ref 80.0–100.0)
PLATELETS: 172 10*3/uL (ref 150–440)
RBC: 3.96 MIL/uL — ABNORMAL LOW (ref 4.40–5.90)
RDW: 13.2 % (ref 11.5–14.5)
WBC: 10 10*3/uL (ref 3.8–10.6)

## 2015-08-08 LAB — BASIC METABOLIC PANEL
ANION GAP: 5 (ref 5–15)
BUN: 11 mg/dL (ref 6–20)
CO2: 26 mmol/L (ref 22–32)
Calcium: 8 mg/dL — ABNORMAL LOW (ref 8.9–10.3)
Chloride: 104 mmol/L (ref 101–111)
Creatinine, Ser: 0.66 mg/dL (ref 0.61–1.24)
GFR calc Af Amer: >60 mL/min (ref >60)
GLUCOSE: 145 mg/dL — AB (ref 65–99)
POTASSIUM: 3.9 mmol/L (ref 3.5–5.1)
Sodium: 135 mmol/L (ref 135–145)

## 2015-08-08 LAB — SURGICAL PATHOLOGY

## 2015-08-08 MED ORDER — ENOXAPARIN SODIUM 40 MG/0.4ML ~~LOC~~ SOLN: 40.0000 mg | SUBCUTANEOUS | Status: DC

## 2015-08-08 MED ORDER — OXYCODONE HCL 5 MG PO TABS: 5.0000 mg | ORAL_TABLET | ORAL | Status: DC | PRN

## 2015-08-08 NOTE — Care Management Note (Addendum)
Case Management Note  Patient Details  Name: James Finley MRN: [id number] Date of Birth: [birth year]  Subjective/Objective:                  Met with patient to discuss discharge planning. 600 mL vomitus emptied from emesis bag; RN notified. He plans to return home at discharge with his wife. She does not drive and he states his brother will assist with transportation. He has a bedside commode but no rolling walker which he will need. He uses [facility] for Rx  [phone]. He has not preference for home health agencies.  Action/Plan: List of home health agencies left with patient. [facility] 60m # 14 [facility]. [place] agencies [place] with patient.   Expected Discharge Date:                  Expected Discharge Plan:     In-House Referral:     Discharge planning Services  CM Consult  Post Acute Care Choice:  Durable Medical Equipment, Home Health Choice offered to:  Patient  DME Arranged:  Walker rolling [facility]  [place]  [facility]     Status of Service:  In process, will continue to follow  Medicare Important Message Given:    Date Medicare IM Given:    Medicare IM give by:    Date Additional Medicare IM Given:    Additional Medicare Important Message give by:     If discussed [place], dates discussed:    Additional Comments: I have notified [name] via fax with [employer] [phone] Contact [phone]. [phone]. Rolling walker delivered. [facility]85[facility]60[facility]   [name] RN 08/08/2015, 10:11 AM

## 2015-08-08 NOTE — Progress Notes (Signed)
Clinical Social Worker (CSW) received SNF consult. PT is recommending home health. RN Case Manager is aware of above. Please reconsult if future social work needs arise. CSW signing off.   Zan Orlick Morgan, LCSWA [phone] 

## 2015-08-08 NOTE — Progress Notes (Signed)
Physical Therapy Treatment Patient Details Name: James Finley MRN: [id number] DOB: 10-11-1955 Today's Date: 08/08/2015    History of Present Illness Pt presents with L TKR on 08/07/15.    PT Comments    Pt able to progress to ambulation 80 feet with RW CGA; occasional vc's required for L TKE during stance phase but no knee buckling noted.  Will continue to progress pt with ambulation distance and progress to stairs per pt tolerance.   Follow Up Recommendations  Home health PT     Equipment Recommendations  Rolling walker with 5" wheels    Recommendations for Other Services       Precautions / Restrictions Precautions Precautions: Knee;Fall Restrictions Weight Bearing Restrictions: Yes LLE Weight Bearing: Weight bearing as tolerated    Mobility  Bed Mobility Overal bed mobility: Needs Assistance Bed Mobility: Supine to Sit     Supine to sit: Modified independent (Device/Increase time);HOB elevated     General bed mobility comments: mild increase time to perform  Transfers Overall transfer level: Needs assistance Equipment used: Rolling walker (2 wheeled) Transfers: Sit to/from Stand Sit to Stand: Min guard         General transfer comment: vc's for hand and feet placement required  Ambulation/Gait Ambulation/Gait assistance: Min guard Ambulation Distance (Feet): 80 Feet Assistive device: Rolling walker (2 wheeled) Gait Pattern/deviations: Step-to pattern;Decreased step length - left;Decreased stance time - left Gait velocity: decreased   General Gait Details: occasional vc's required for TKE in L stance phase; occasional vc's for walker technique required   Stairs            Wheelchair Mobility    Modified Rankin (Stroke Patients Only)       Balance Overall balance assessment: Needs assistance Sitting-balance support: Bilateral upper extremity supported;Feet supported Sitting balance-Leahy Scale: Good     Standing balance support:  Bilateral upper extremity supported (on RW) Standing balance-Leahy Scale: Good                      Cognition Arousal/Alertness: Awake/alert Behavior During Therapy: WFL for tasks assessed/performed Overall Cognitive Status: Within Functional Limits for tasks assessed                      Exercises   Performed semi-supine B LE therapeutic exercise x 10 reps:  Ankle pumps (AROM B LE's); quad sets x3 second holds (AROM B LE's); glute squeezes x3 second holds (AROM B); SAQ's (AROM R; AAROM L); heelslides (AROM R; AROM L), hip abd/adduction (AROM R; AROM L), and SLR (AROM R; AAROM L).  Pt required vc's and tactile cues for correct technique with exercises.     General Comments General comments (skin integrity, edema, etc.): dressings in place; bloody drainage noted posterior L knee through dressing onto bed (nursing notified and came to assess and MD Rosita Kea present during session and notified as well)  Nursing cleared pt for participation in physical therapy.  Pt agreeable to PT session. New RW brought to pt that was appropriate for pt's height.      Pertinent Vitals/Pain Pain Assessment: 0-10 Pain Score: 3  Pain Location: L knee Pain Descriptors / Indicators: Constant;Sore;Tender Pain Intervention(s): Limited activity within patient's tolerance;Monitored during session;Premedicated before session;Repositioned;Ice applied  Nursing cleared pt for participation in physical therapy.  Pt agreeable to PT session.    Home Living  Prior Function            PT Goals (current goals can now be found in the care plan section) Acute Rehab PT Goals Patient Stated Goal: to go home PT Goal Formulation: With patient Time For Goal Achievement: 08/21/15 Potential to Achieve Goals: Good Additional Goals Additional Goal #1: Pt will be able to perform bed mobiltiy/transfers with independence in order to improve functional mobility,. Progress towards PT  goals: Progressing toward goals    Frequency  BID    PT Plan Current plan remains appropriate    Co-evaluation             End of Session Equipment Utilized During Treatment: Gait belt Activity Tolerance: Patient tolerated treatment well Patient left: in chair;with call bell/phone within reach;with SCD's reapplied (B heels elevated via towel rolls; polar care in place; unable to find chair alarm (nursing notified and pt educated to call nursing and not get up without assist))     Time: 1030-1110 PT Time Calculation (min) (ACUTE ONLY): 40 min  Charges:  $Gait Training: 8-22 mins $Therapeutic Exercise: 8-22 mins $Therapeutic Activity: 8-22 mins                    G CodesHendricks Limes:      Emmalynn Pinkham 08/08/2015, 1:37 PM Hendricks LimesEmily Lovely Kerins, PT [phone]

## 2015-08-08 NOTE — Progress Notes (Signed)
   Subjective: 1 Day Post-Op Procedure(s) (LRB): TOTAL KNEE ARTHROPLASTY (Left) Patient reports pain as mild.   Patient is well, and has had no acute complaints or problems We will start therapy today.  Plan is to go Home after hospital stay.  Objective: Vital signs in last 24 hours: Temp:  [97.4 F (36.3 C)-100.6 F (38.1 C)] 98.6 F (37 C) (11/23 0509) Pulse Rate:  [51-85] 83 (11/23 0509) Resp:  [16-18] 18 (11/23 0509) BP: (117-152)/(80-96) 148/89 mmHg (11/23 0509) SpO2:  [99 %-100 %] 99 % (11/23 0509)  Intake/Output from previous day: 11/22 0701 - 11/23 0700 In: 3056.3 [P.O.:760; I.V.:2296.3] Out: 2450 [Urine:2400; Blood:50] Intake/Output this shift:     Recent Labs  08/07/15 1125 08/08/15 0441  HGB 12.5* 11.5*    Recent Labs  08/07/15 1125 08/08/15 0441  WBC 6.1 10.0  RBC 4.21* 3.96*  HCT 37.8* 35.5*  PLT 177 172    Recent Labs  08/07/15 1125 08/08/15 0441  NA  --  135  K  --  3.9  CL  --  104  CO2  --  26  BUN  --  11  CREATININE 0.65 0.66  GLUCOSE  --  145*  CALCIUM  --  8.0*   No results for input(s): LABPT, INR in the last 72 hours.  EXAM General - Patient is Alert, Appropriate and Oriented Extremity - Neurovascular intact Sensation intact distally Intact pulses distally Dorsiflexion/Plantar flexion intact Dressing - dressing C/D/I and no drainage Motor Function - intact, moving foot and toes well on exam.   Past Medical History  Diagnosis Date  . Hypercholesteremia unk  . Arthritis   . Diabetes mellitus without complication (HCC)     diet controlled    Assessment/Plan:   1 Day Post-Op Procedure(s) (LRB): TOTAL KNEE ARTHROPLASTY (Left) Active Problems:   Primary osteoarthritis of left knee  Estimated body mass index is 23.1 kg/(m^2) as calculated from the following:   Height as of this encounter: 6\' 2"  (1.88 m).   Weight as of this encounter: 81.647 kg (180 lb). Advance diet Up with therapy  Needs BM Recheck labs in the  am  DVT Prophylaxis - Lovenox, Foot Pumps and TED hose Weight-Bearing as tolerated to left leg D/C O2 and Pulse OX and try on Room Air  T. Cranston Neighborhris Gaines, PA-C [facility] 08/08/2015, 7:41 AM

## 2015-08-08 NOTE — Progress Notes (Signed)
Physical Therapy Treatment Patient Details Name: James FailRickey Westra MRN: [id number] DOB: 01/12/1956 Today's Date: 08/08/2015    History of Present Illness Pt presents with L TKR on 08/07/15.    PT Comments    Pt able to ambulate around the nurses station with RW and CGA; some cueing required for TKE during L LE stance phase but no L knee buckling noted.  Pt is progressing well with functional mobility.  Need to assess stairs tomorrow (pt has 10 steps to enter home with railings).   Follow Up Recommendations  Home health PT (assist for stairs)     Equipment Recommendations  Rolling walker with 5" wheels    Recommendations for Other Services       Precautions / Restrictions Precautions Precautions: Knee;Fall Restrictions Weight Bearing Restrictions: Yes LLE Weight Bearing: Weight bearing as tolerated    Mobility  Bed Mobility Overal bed mobility: Needs Assistance Bed Mobility: Sit to Supine     Sit to supine: Min assist   General bed mobility comments: assist for L LE and vc's for technique required  Transfers Overall transfer level: Needs assistance Equipment used: Rolling walker (2 wheeled) Transfers: Sit to/from Stand Sit to Stand: Min guard         General transfer comment: occasional vc's for hand and feet placement required  Ambulation/Gait Ambulation/Gait assistance: Min guard Ambulation Distance (Feet): 220 Feet Assistive device: Rolling walker (2 wheeled) Gait Pattern/deviations: Step-to pattern;Decreased step length - left;Decreased stance time - left;Antalgic Gait velocity: decreased   General Gait Details: occasional vc's required for TKE in L stance phase; occasional vc's for walker technique required   Stairs            Wheelchair Mobility    Modified Rankin (Stroke Patients Only)       Balance Overall balance assessment: Needs assistance Sitting-balance support: Bilateral upper extremity supported;Feet supported Sitting  balance-Leahy Scale: Good     Standing balance support: Bilateral upper extremity supported (on RW) Standing balance-Leahy Scale: Good                      Cognition Arousal/Alertness: Awake/alert Behavior During Therapy: WFL for tasks assessed/performed Overall Cognitive Status: Within Functional Limits for tasks assessed                      Exercises Total Joint Exercises Goniometric ROM: L knee extension 18 degrees short of neutral semi-supine; L knee flexion 77 degrees in sitting  Performed sitting exercises x 10 reps B LE's:  Heel/toe raises (AROM R; AROM L); LAQ's (AROM R; AAROM L); marching/hip flexion (AROM R; AROM L).  Pt required vc's and tactile cues for correct technique with exercises.     General Comments Pt agreeable to PT session.      Pertinent Vitals/Pain Pain Assessment: 0-10 Pain Score: 7  Pain Location: L knee Pain Descriptors / Indicators: Constant;Aching;Sore;Tender Pain Intervention(s): Limited activity within patient's tolerance;Monitored during session;Repositioned;Ice applied  Vitals stable and WFL throughout treatment session.    Home Living                      Prior Function            PT Goals (current goals can now be found in the care plan section) Acute Rehab PT Goals Patient Stated Goal: to go home PT Goal Formulation: With patient Time For Goal Achievement: 08/21/15 Potential to Achieve Goals: Good Additional Goals Additional Goal #1: Pt will  be able to perform bed mobiltiy/transfers with independence in order to improve functional mobility,. Progress towards PT goals: Progressing toward goals    Frequency  BID    PT Plan Current plan remains appropriate    Co-evaluation             End of Session Equipment Utilized During Treatment: Gait belt Activity Tolerance: No increased pain Patient left: in bed;with call bell/phone within reach;with bed alarm set;with SCD's reapplied (B heels elevated  via towel rolls; polar care in place)     Time: 1350-1420 PT Time Calculation (min) (ACUTE ONLY): 30 min  Charges:  $Gait Training: 8-22 mins $Therapeutic Exercise: 8-22 mins                    G CodesHendricks Limes 08/28/15, 4:00 PM [name], PT [phone]

## 2015-08-08 NOTE — Discharge Instructions (Signed)

## 2015-08-08 NOTE — Evaluation (Signed)
Occupational Therapy Evaluation Patient Details Name: James Finley MRN: [id number] DOB: 1956/01/04 Today's Date: 08/08/2015    History of Present Illness Pt presents with L TKR on 08/07/15.   Clinical Impression   This patient is a 59  year old male who came to [facility] for a L total knee replacement.  Patient lives in a  home with his wife.  He had been independent with ADL and functional mobility and works full time on the docks.  Henow requires assist and would benefit from Occupational Therapy for ADL/functioal mobility training.      Follow Up Recommendations   (home with PT)    Equipment Recommendations       Recommendations for Other Services       Precautions / Restrictions Precautions Precautions: Knee Restrictions LLE Weight Bearing: Weight bearing as tolerated      Mobility Bed Mobility                  Transfers                      Balance                                            ADL                                         General ADL Comments: had been independent. He now needs some assist and practiced techniques for lower body dressing using hip kit with cues.     Vision     Perception     Praxis      Pertinent Vitals/Pain       Hand Dominance     Extremity/Trunk Assessment Upper Extremity Assessment Upper Extremity Assessment: Overall WFL for tasks assessed   Lower Extremity Assessment Lower Extremity Assessment: Defer to PT evaluation       Communication Communication Communication: No difficulties   Cognition Arousal/Alertness: Awake/alert Behavior During Therapy: WFL for tasks assessed/performed Overall Cognitive Status: Within Functional Limits for tasks assessed                     General Comments       Exercises       Shoulder Instructions      Home Living Family/patient expects to be discharged to:: Private  residence Living Arrangements: Spouse/significant other Available Help at Discharge: Family Type of Home: House Home Access: Stairs to enter Technical brewer of Steps: 10 Entrance Stairs-Rails: Can reach both Home Layout: One level               Home Equipment: None          Prior Functioning/Environment Level of Independence: Independent        Comments: working full time on dock    OT Diagnosis: Acute pain   OT Problem List:     OT Treatment/Interventions: Self-care/ADL training    OT Goals(Current goals can be found in the care plan section) Acute Rehab OT Goals Patient Stated Goal: to go home OT Goal Formulation: With patient Time For Goal Achievement: 08/22/15 Potential to Achieve Goals: Good  OT Frequency: Min 1X/week   Barriers to D/C:  Co-evaluation              End of Session Equipment Utilized During Treatment:  (hip kit)  Activity Tolerance:   Patient left: in bed;with bed alarm set;with call bell/phone within reach   Time: 7096-4383 OT Time Calculation (min): 13 min Charges:  OT General Charges $OT Visit: 1 Procedure OT Evaluation $Initial OT Evaluation Tier I: 1 Procedure G-Codes:    Myrene Galas, MS/OTR/L  08/08/2015, 9:38 AM

## 2015-08-08 NOTE — Plan of Care (Signed)
RN received call from PT requesting Pt Wound/dressing assessment d/t noted bleeding on sheets.  RN went to room and noted moderate dried blood on sheets. Pt asymptomatic. RN undressed ace wrap to see underneath.  Ortho alked into room and confirmed normal bleeding and just asked to continue to assess.  Wound re-wrapped for PT to work with pt.

## 2015-08-08 NOTE — Anesthesia Postprocedure Evaluation (Signed)
Anesthesia Post Note  Patient: James Finley  Procedure(s) Performed: Procedure(s) (LRB): TOTAL KNEE ARTHROPLASTY (Left)  Patient location during evaluation: PACU Anesthesia Type: General Level of consciousness: awake and alert Pain management: pain level controlled Vital Signs Assessment: post-procedure vital signs reviewed and stable Respiratory status: spontaneous breathing, nonlabored ventilation, respiratory function stable and patient connected to nasal cannula oxygen Cardiovascular status: blood pressure returned to baseline and stable Postop Assessment: No signs of nausea or vomiting Anesthetic complications: no    Last Vitals:  Filed Vitals:   08/08/15 0509 08/08/15 0742  BP: 148/89 151/88  Pulse: 83 95  Temp: 37 C   Resp: 18 18    Last Pain:  Filed Vitals:   08/08/15 0940  PainSc: 7     LLE Motor Response: Purposeful movement LLE Sensation: Full sensation RLE Motor Response: Purposeful movement RLE Sensation: Full sensation      Lenard SimmerAndrew Tashay Bozich

## 2015-08-08 NOTE — Progress Notes (Signed)
Foley d/c'd at 0610 with 250cc output

## 2015-08-08 NOTE — Discharge Summary (Signed)
Physician Discharge Summary  Patient ID: Dorman Calderwood MRN: [id number] DOB/AGE: 1955-12-30 59 y.o.  Admit date: 08/07/2015 Discharge date: 08/08/2015  Admission Diagnoses:  PRIMARY OSTEOARTHRITIS   Discharge Diagnoses: Patient Active Problem List   Diagnosis Date Noted  . Primary osteoarthritis of left knee 08/07/2015    Past Medical History  Diagnosis Date  . Hypercholesteremia unk  . Arthritis   . Diabetes mellitus without complication (HCC)     diet controlled     Transfusion: none   Consultants (if any):    Discharged Condition: Improved  Hospital Course: Jabes Primo is an 59 y.o. male who was admitted 08/07/2015 with a diagnosis of left knee osteoarthritis and went to the operating room on 08/07/2015 and underwent the above named procedures.    Surgeries: Procedure(s): TOTAL KNEE ARTHROPLASTY on 08/07/2015 Patient tolerated the surgery well. Taken to PACU where she was stabilized and then transferred to the orthopedic floor.  Started on Lovenox  30 q  12 hrs. Foot pumps applied bilaterally at 80 mm. Heels elevated on bed with rolled towels. No evidence of DVT. Negative Homan. Physical therapy started on day #1 for gait training and transfer. OT started day #1 for ADL and assisted devices.  Patient's foley was d/c on day #1. Patient's IV  was d/c on day #2.  On post op day #2 patient was stable and ready for discharge to  Home with home health.  Implants: Medacta GMK sphere 4+ femur, 5 left tibia with short stem 10 mm insert and 4 patella, all components cemented  He was given perioperative antibiotics:  Anti-infectives    Start     Dose/Rate Route Frequency Ordered Stop   08/07/15 1130  ceFAZolin (ANCEF) IVPB 2 g/50 mL premix     2 g 100 mL/hr over 30 Minutes Intravenous Every 6 hours 08/07/15 1044 08/07/15 2321   08/07/15 0558  ceFAZolin (ANCEF) 2-3 GM-% IVPB SOLR    Comments:  Letta Pate: cabinet override      08/07/15 0558 08/07/15 1759   08/07/15 0215  ceFAZolin (ANCEF) IVPB 2 g/50 mL premix     2 g 100 mL/hr over 30 Minutes Intravenous  Once 08/07/15 0212 08/07/15 0807    .  He was given sequential compression devices, early ambulation, and  Lovenox for DVT prophylaxis.  He benefited maximally from the hospital stay and there were no complications.    Recent vital signs:  Filed Vitals:   08/08/15 0742 08/08/15 1525  BP: 151/88 148/96  Pulse: 95 106  Temp:  99.6 F (37.6 C)  Resp: 18 18    Recent laboratory studies:  Lab Results  Component Value Date   HGB 11.5* 08/08/2015   HGB 12.5* 08/07/2015   HGB 12.9* 07/25/2015   Lab Results  Component Value Date   WBC 10.0 08/08/2015   PLT 172 08/08/2015   Lab Results  Component Value Date   INR 1.08 07/25/2015   Lab Results  Component Value Date   NA 135 08/08/2015   K 3.9 08/08/2015   CL 104 08/08/2015   CO2 26 08/08/2015   BUN 11 08/08/2015   CREATININE 0.66 08/08/2015   GLUCOSE 145* 08/08/2015    Discharge Medications:     Medication List    TAKE these medications        ARTIFICIAL TEARS OP  Apply to eye.     enoxaparin 40 MG/0.4ML injection  Commonly known as:  LOVENOX  Inject 0.4 mLs (40 mg total) into the skin  daily.     meloxicam 7.5 MG tablet  Commonly known as:  MOBIC  Take 7.5 mg by mouth daily.     naproxen sodium 220 MG tablet  Commonly known as:  ANAPROX  Take 220 mg by mouth 2 (two) times daily as needed.     OSTEO BI-FLEX REGULAR STRENGTH PO  Take 1 tablet by mouth daily.     oxyCODONE 5 MG immediate release tablet  Commonly known as:  Oxy IR/ROXICODONE  Take 1-2 tablets (5-10 mg total) by mouth every 3 (three) hours as needed for breakthrough pain.     pravastatin 20 MG tablet  Commonly known as:  PRAVACHOL  Take 20 mg by mouth daily.        Diagnostic Studies: Dg Knee 1-2 Views Left  2015/08/15  CLINICAL DATA:  Postop left total knee arthroplasty. EXAM: LEFT KNEE - 1-2 VIEW COMPARISON:  CT 07/12/2015.  FINDINGS: Patient is status post left total knee arthroplasty. The hardware is well position. There is no evidence of acute fracture or dislocation. There is moderate gas in the joint and surrounding soft tissues. Anterior skin staples are noted. IMPRESSION: No demonstrated complication following left total knee arthroplasty. Electronically Signed   By: Carey Bullocks M.D.   On: August 15, 2015 10:02   Ct Knee Left Wo Contrast  07/18/2015  ADDENDUM REPORT: 07/18/2015 10:58 ADDENDUM: Typographical error in the original dictation. The examination is of the LEFT knee. The corrected dictation is below. CLINICAL DATA:  Primary osteoarthritis of the LEFT knee. Preop knee replacement. EXAM: CT OF THE RIGHT KNEE WITHOUT CONTRAST TECHNIQUE: Multidetector CT imaging of the LEFT knee was performed according to the standard protocol. Multiplanar CT image reconstructions were also generated. COMPARISON:  None. FINDINGS: The hip demonstrates a total left hip arthroplasty without failure or complication. Beam hardening artifact partially obscures adjacent soft tissue and osseous structures. There is no lytic or blastic lesion. The knee demonstrates no fracture or dislocation. There is no lytic or blastic lesion. There is tricompartmental osteoarthritis of the right knee most severe in the medial femorotibial compartment with subchondral sclerosis, subchondral cystic changes, marginal osteophytosis and severe joint space narrowing. There is a small joint effusion. There is a loose body posterior to the medial tibial plateau. The ankle demonstrates no fracture or dislocation. There is no lytic or blastic lesion. IMPRESSION: 1. Tricompartmental osteoarthritis of the LEFT knee most severe in the medial femorotibial compartment. Electronically Signed   By: Elige Ko   On: 07/18/2015 10:58  07/18/2015  CLINICAL DATA:  Primary osteoarthritis of the right knee. Preop knee replacement. EXAM: CT OF THE RIGHT KNEE WITHOUT CONTRAST  TECHNIQUE: Multidetector CT imaging of the RIGHT knee was performed according to the standard protocol. Multiplanar CT image reconstructions were also generated. COMPARISON:  None. FINDINGS: The hip demonstrates a total left hip arthroplasty without failure or complication. Beam hardening artifact partially obscures adjacent soft tissue and osseous structures. There is no lytic or blastic lesion. The knee demonstrates no fracture or dislocation. There is no lytic or blastic lesion. There is tricompartmental osteoarthritis of the right knee most severe in the medial femorotibial compartment with subchondral sclerosis, subchondral cystic changes, marginal osteophytosis and severe joint space narrowing. There is a small joint effusion. There is a loose body posterior to the medial tibial plateau. The ankle demonstrates no fracture or dislocation. There is no lytic or blastic lesion. IMPRESSION: 1. Tricompartmental osteoarthritis of the right knee most severe in the medial femorotibial compartment. Electronically Signed: By:  Elige KoHetal  Patel On: 07/12/2015 15:46    Disposition: 01-Home or Self Care        Follow-up Information    Follow up with MENZ,MICHAEL, MD In 2 weeks.   Specialty:  Orthopedic Surgery   Why:  For staple removal and skin check   Contact information:   [address] [facility] [place] [address] [id number]        Signed: Patience MuscaGAINES, THOMAS CHRISTOPHER 08/08/2015, 4:55 PM

## 2015-08-08 NOTE — Care Management Note (Addendum)
Case Management Note  Patient Details  Name: James Finley MRN: [id number] Date of Birth: 07/28/1956  Subjective/Objective:  [facility]. Referral to Dorene SorrowJerry and [facility] [place], lovenox with have to be ordered but will be in on Friday. Price is 85.60. Spoke with patient and he denies issues paying for medication. Patient made aware with dc planned for approximately Friday.  Action/Plan:   Expected Discharge Date:                  Expected Discharge Plan:     In-House Referral:     Discharge planning Services  CM Consult  Post Acute Care Choice:  Durable Medical Equipment, Home Health Choice offered to:  Patient  DME Arranged:  Walker rolling DME Agency:  [facility]  [facility]  [facility]     Status of Service:  In process, will continue to follow  Medicare Important Message Given:    Date Medicare IM Given:    Medicare IM give by:    Date Additional Medicare IM Given:    Additional Medicare Important Message give by:     If discussed at Long Length of Stay Meetings, dates discussed:    Additional Comments:  Marily MemosLisa M Renae Mottley, RN 08/08/2015, 11:45 AM

## 2015-08-09 LAB — CBC
HCT: 34.1 % — ABNORMAL LOW (ref 40.0–52.0)
Hemoglobin: 11.3 g/dL — ABNORMAL LOW (ref 13.0–18.0)
MCH: 29.3 pg (ref 26.0–34.0)
MCHC: 33.3 g/dL (ref 32.0–36.0)
MCV: 88.1 fL (ref 80.0–100.0)
PLATELETS: 164 10*3/uL (ref 150–440)
RBC: 3.87 MIL/uL — AB (ref 4.40–5.90)
RDW: 12.8 % (ref 11.5–14.5)
WBC: 11.6 10*3/uL — ABNORMAL HIGH (ref 3.8–10.6)

## 2015-08-09 LAB — BASIC METABOLIC PANEL
Anion gap: 6 (ref 5–15)
BUN: 11 mg/dL (ref 6–20)
CALCIUM: 8.3 mg/dL — AB (ref 8.9–10.3)
CO2: 26 mmol/L (ref 22–32)
CREATININE: 0.56 mg/dL — AB (ref 0.61–1.24)
Chloride: 100 mmol/L — ABNORMAL LOW (ref 101–111)
GFR calc Af Amer: >60 mL/min (ref >60)
GLUCOSE: 151 mg/dL — AB (ref 65–99)
Potassium: 4 mmol/L (ref 3.5–5.1)
SODIUM: 132 mmol/L — AB (ref 135–145)

## 2015-08-09 MED ORDER — PANTOPRAZOLE SODIUM 40 MG IV SOLR
40.0000 mg | Freq: Two times a day (BID) | INTRAVENOUS | Status: DC
Start: 2015-08-09 — End: 2015-08-10
  Administered 2015-08-09 – 2015-08-10 (×3): 40 mg via INTRAVENOUS
  Filled 2015-08-09 (×3): qty 40

## 2015-08-09 MED ORDER — SODIUM CHLORIDE 0.9 % IV SOLN
INTRAVENOUS | Status: DC
  Administered 2015-08-09: 16:00:00 via INTRAVENOUS

## 2015-08-09 MED ORDER — METOCLOPRAMIDE HCL 10 MG PO TABS: 10.0000 mg | ORAL_TABLET | Freq: Three times a day (TID) | ORAL | Status: DC

## 2015-08-09 MED ORDER — OXYCODONE HCL 5 MG PO TABS: 5.0000 mg | ORAL_TABLET | ORAL | Status: DC | PRN

## 2015-08-09 MED ORDER — HYDROCODONE-ACETAMINOPHEN 5-325 MG PO TABS
1.0000 | ORAL_TABLET | ORAL | Status: DC | PRN
  Administered 2015-08-09: 1 via ORAL
  Administered 2015-08-10: 2 via ORAL
  Administered 2015-08-10: 1 via ORAL
  Administered 2015-08-10: 2 via ORAL
  Filled 2015-08-09 (×2): qty 1
  Filled 2015-08-09 (×2): qty 2

## 2015-08-09 MED ORDER — ONDANSETRON HCL 4 MG PO TABS: 4.0000 mg | ORAL_TABLET | Freq: Four times a day (QID) | ORAL | Status: DC | PRN

## 2015-08-09 MED ORDER — METOCLOPRAMIDE HCL 5 MG/ML IJ SOLN
10.0000 mg | Freq: Four times a day (QID) | INTRAMUSCULAR | Status: DC
  Administered 2015-08-09 – 2015-08-10 (×3): 10 mg via INTRAVENOUS
  Filled 2015-08-09 (×3): qty 2

## 2015-08-09 NOTE — Care Management Note (Signed)
Case Management Note  Patient Details  Name: James Finley MRN: [id number] Date of Birth: 05/17/1956  Subjective/Objective: [facility].  James Finley already has a rolling walker and a BSC.                   Action/Plan:   Expected Discharge Date:                  Expected Discharge Plan:     In-House Referral:     Discharge planning Services  CM Consult  Post Acute Care Choice:  Durable Medical Equipment, Home Health Choice offered to:  Patient  DME Arranged:  Walker rolling DME Agency:  [facility]  [facility]  [facility]     Status of Service:  In process, will continue to follow  Medicare Important Message Given:    Date Medicare IM Given:    Medicare IM give by:    Date Additional Medicare IM Given:    Additional Medicare Important Message give by:     If discussed at Long Length of Stay Meetings, dates discussed:    Additional Comments:  Treena Cosman A, RN 08/09/2015, 9:31 AM

## 2015-08-09 NOTE — Progress Notes (Addendum)
Increased weakness after physical tx and lethargy.remains nauseated Pulse 122. Dr Ernest Pinehooten notified. . md orders ekg and ns at 100/hr

## 2015-08-09 NOTE — Progress Notes (Signed)
ACCORDING TO WIFE PT IS ALLERGIC TO OXYCODONE AND WANTS [facility] PRESCRIBED. DR HOOTEN NOTIFIED. HOME MED SWITCHED TO [facility]. PASSING FLATUS NOW

## 2015-08-09 NOTE — Progress Notes (Signed)
Able to self inject lovenox but required moderate instruction. Will continue teaching

## 2015-08-09 NOTE — Progress Notes (Signed)
Physical Therapy Treatment Patient Details Name: James Finley MRN: [id number] DOB: 03/06/1956 Today's Date: 08/09/2015    History of Present Illness Pt presents with L TKR on 08/07/15.    PT Comments    Attempted multiple times to see pt in the morning but pt declining d/t not feeling well and wanting to have a BM prior to PT.  Attempted again around lunch time but pt just had large emesis so deferred PT until the afternoon.  Pt reporting feeling better in afternoon although still without BM.  Pt tolerated ex's in bed and walking fairly well (c/o 7/10 L knee pain).  End of session, pt having difficulty keeping his eyes open in bed and falling asleep; pt still able to talk with PT upon waking up and no change in overall strength noted; BP 134/80 with O2 96% on room air and HR 122 bpm; nursing notified of all of this and stated that nursing would come check on pt.  Still need to trial stairs prior to discharge.   Follow Up Recommendations  Home health PT (assist for stairs)     Equipment Recommendations  Rolling walker with 5" wheels    Recommendations for Other Services       Precautions / Restrictions Precautions Precautions: Knee;Fall Restrictions Weight Bearing Restrictions: Yes LLE Weight Bearing: Weight bearing as tolerated    Mobility  Bed Mobility Overal bed mobility: Needs Assistance Bed Mobility: Supine to Sit;Sit to Supine     Supine to sit: Supervision Sit to supine: Supervision   General bed mobility comments: pt using UE's to assist with L LE; extra time required to perform on his own  Transfers Overall transfer level: Needs assistance Equipment used: Rolling walker (2 wheeled) Transfers: Sit to/from Stand Sit to Stand: Supervision         General transfer comment: SBA for safety  Ambulation/Gait Ambulation/Gait assistance: Min guard Ambulation Distance (Feet): 220 Feet Assistive device: Rolling walker (2 wheeled) Gait Pattern/deviations: Step-to  pattern;Decreased step length - left;Decreased stance time - left;Antalgic Gait velocity: decreased   General Gait Details: occasional vc's required for TKE in L stance phase   Stairs            Wheelchair Mobility    Modified Rankin (Stroke Patients Only)       Balance                                    Cognition Arousal/Alertness: Awake/alert (pt appearing lethargic end of session) Behavior During Therapy: WFL for tasks assessed/performed Overall Cognitive Status: Within Functional Limits for tasks assessed                      Exercises Total Joint Exercises Goniometric ROM: L knee extension 14 degrees short of neutral semi-supine; L knee flexion 84 degrees in sitting  Performed semi-supine B LE therapeutic exercise x 10 reps:  Ankle pumps (AROM B LE's); quad sets x3 second holds (AROM B LE's); glute squeezes x3 second holds (AROM B); SAQ's (AROM R; AAROM L); heelslides (AROM R; AAROM L), hip abd/adduction (AROM R; AROM L), and SLR (AROM R; initial AAROM then AROM L).  Pt required vc's and tactile cues for correct technique with exercises.     General Comments   Nursing cleared pt for participation in physical therapy.  Pt agreeable to PT session.      Pertinent Vitals/Pain Pain Assessment: 0-10 Pain  Score: 7  Pain Location: L knee Pain Descriptors / Indicators: Constant;Sore;Aching;Tender Pain Intervention(s): Limited activity within patient's tolerance;Monitored during session;Repositioned;Ice applied    Home Living                      Prior Function            PT Goals (current goals can now be found in the care plan section) Acute Rehab PT Goals Patient Stated Goal: to go home PT Goal Formulation: With patient Time For Goal Achievement: 08/21/15 Potential to Achieve Goals: Good Additional Goals Additional Goal #1: Pt will be able to perform bed mobiltiy/transfers with independence in order to improve functional  mobility,. Progress towards PT goals: Progressing toward goals    Frequency  BID    PT Plan Current plan remains appropriate    Co-evaluation             End of Session Equipment Utilized During Treatment: Gait belt Activity Tolerance: Patient limited by fatigue;Patient limited by pain Patient left: in bed;with call bell/phone within reach;with bed alarm set;with SCD's reapplied (B heels elevated via towel rolls; polar care in place)     Time: [id number] PT Time Calculation (min) (ACUTE ONLY): 40 min  Charges:  $Gait Training: 8-22 mins $Therapeutic Exercise: 8-22 mins $Therapeutic Activity: 8-22 mins                    G CodesHendricks Limes:      Shaleen Talamantez 08/09/2015, 3:45 PM Hendricks LimesEmily Slyvester Latona, PT [phone]

## 2015-08-09 NOTE — Care Management Note (Signed)
Case Management Note  Patient Details  Name: James Finley MRN: [id number] Date of Birth: 07/22/1956  Subjective/Objective:     Discharge home today was cancelled due to vomiting.                Action/Plan:   Expected Discharge Date:                  Expected Discharge Plan:     In-House Referral:     Discharge planning Services  CM Consult  Post Acute Care Choice:  Durable Medical Equipment, Home Health Choice offered to:  Patient  DME Arranged:  Walker rolling DME Agency:  [facility]  [facility]  [facility]     Status of Service:  In process, will continue to follow  Medicare Important Message Given:    Date Medicare IM Given:    Medicare IM give by:    Date Additional Medicare IM Given:    Additional Medicare Important Message give by:     If discussed at Long Length of Stay Meetings, dates discussed:    Additional Comments:  Saiya Crist A, RN 08/09/2015, 12:51 PM

## 2015-08-09 NOTE — Progress Notes (Addendum)
   Subjective: 2 Days Post-Op Procedure(s) (LRB): TOTAL KNEE ARTHROPLASTY (Left) Patient reports pain as mild.   Patient is well, but has had some minor complaints of nausea. No Abd pain or chest pain. We will continue therapy today.  Plan is to go Home after hospital stay.  Objective: Vital signs in last 24 hours: Temp:  [99 F (37.2 C)-100.1 F (37.8 C)] 100.1 F (37.8 C) (11/24 0341) Pulse Rate:  [104-107] 107 (11/24 0341) Resp:  [18] 18 (11/24 0341) BP: (144-155)/(83-96) 144/83 mmHg (11/24 0341) SpO2:  [97 %-98 %] 97 % (11/24 0341)  Intake/Output from previous day: 11/23 0701 - 11/24 0700 In: 240 [P.O.:240] Out: 2750 [Urine:2150; Emesis/NG output:600] Intake/Output this shift:     Recent Labs  08/07/15 1125 08/08/15 0441 08/09/15 0508  HGB 12.5* 11.5* 11.3*    Recent Labs  08/08/15 0441 08/09/15 0508  WBC 10.0 11.6*  RBC 3.96* 3.87*  HCT 35.5* 34.1*  PLT 172 164    Recent Labs  08/08/15 0441 08/09/15 0508  NA 135 132*  K 3.9 4.0  CL 104 100*  CO2 26 26  BUN 11 11  CREATININE 0.66 0.56*  GLUCOSE 145* 151*  CALCIUM 8.0* 8.3*   No results for input(s): LABPT, INR in the last 72 hours.  EXAM General - Patient is Alert, Appropriate and Oriented Extremity - Neurovascular intact Sensation intact distally Intact pulses distally Dorsiflexion/Plantar flexion intact Dressing - dressing C/D/I and no drainage, honeycomb applied Motor Function - intact, moving foot and toes well on exam.  Abdomen: non tender to palpation. Soft.  Past Medical History  Diagnosis Date  . Hypercholesteremia unk  . Arthritis   . Diabetes mellitus without complication (HCC)     diet controlled    Assessment/Plan:   2 Days Post-Op Procedure(s) (LRB): TOTAL KNEE ARTHROPLASTY (Left) Active Problems:   Primary osteoarthritis of left knee  Estimated body mass index is 23.1 kg/(m^2) as calculated from the following:   Height as of this encounter: 6\' 2"  (1.88 m).  Weight as of this encounter: 81.647 kg (180 lb). Advance diet Up with therapy  Needs BM before discharge Encouraged incentive spirometer Start reglan for nausea Discharge home with home health pending BM  DVT Prophylaxis - Lovenox, Foot Pumps and TED hose Weight-Bearing as tolerated to left leg D/C O2 and Pulse OX and try on Room Air  T. Cranston Neighborhris Charron Coultas, PA-C [facility] 08/09/2015, 8:06 AM

## 2015-08-09 NOTE — Care Management Note (Signed)
Case Management Note  Patient Details  Name: James Finley MRN: [id number] Date of Birth: 05/22/1956  Subjective/Objective:    Discharge information and request for home health PT called to She[facility] Stakes[facility] and [facility]. [name] already has a rolling walker and a BSC.                 Action/Plan:   Expected Discharge Date:                  Expected Discharge Plan:     In-House Referral:     Discharge planning Services  CM Consult  Post Acute Care Choice:  Durable Medical Equipment, Home Health Choice offered to:  Patient  DME Arranged:  Walker rolling DME Agency:  [facility]  [facility]     Status of Service:  In process, will continue to follow  Medicare Important Message Given:    Date Medicare IM Given:    Medicare IM give by:    Date Additional Medicare IM Given:    Additional Medicare Important Message give by:     If discussed at Long Length of Stay Meetings, dates discussed:    Additional Comments:  Aj Crunkleton A, RN 08/09/2015, 11:11 AM

## 2015-08-09 NOTE — Progress Notes (Signed)
Pt vomited profuse amount of undigested food all over floor and clothing.no abdominal distention . Barely passing flatus. Spoke with dr Ernest Pinehooten . md ordered discontinue discharge,chg reglan to 10mg  iv q6hr,protonix 40 mg iv q12hr

## 2015-08-10 ENCOUNTER — Inpatient Hospital Stay: Payer: 59

## 2015-08-10 LAB — TSH: TSH: 1.05 u[IU]/mL (ref 0.350–4.500)

## 2015-08-10 MED ORDER — ENOXAPARIN SODIUM 40 MG/0.4ML ~~LOC~~ SOLN: 40.0000 mg | SUBCUTANEOUS | Status: DC

## 2015-08-10 MED ORDER — HYDROCODONE-ACETAMINOPHEN 5-325 MG PO TABS: 1.0000 | ORAL_TABLET | ORAL | Status: DC | PRN

## 2015-08-10 MED ORDER — PANTOPRAZOLE SODIUM 40 MG PO TBEC: 40.0000 mg | DELAYED_RELEASE_TABLET | Freq: Two times a day (BID) | ORAL | Status: DC

## 2015-08-10 NOTE — Care Management Note (Signed)
Case Management Note  Patient Details  Name: James Finley MRN: [id number] Date of Birth: 12/31/1955  Subjective/Objective: [facility].                    Action/Plan:   Expected Discharge Date:                  Expected Discharge Plan:     In-House Referral:     Discharge planning Services  CM Consult  Post Acute Care Choice:  Durable Medical Equipment, Home Health Choice offered to:  Patient  DME Arranged:  Walker rolling DME Agency:  [facility]  [facility]  [facility]     Status of Service:  In process, will continue to follow  Medicare Important Message Given:    Date Medicare IM Given:    Medicare IM give by:    Date Additional Medicare IM Given:    Additional Medicare Important Message give by:     If discussed at Long Length of Stay Meetings, dates discussed:    Additional Comments:  Jamichael Knotts A, RN 08/10/2015, 9:56 AM

## 2015-08-10 NOTE — Progress Notes (Signed)
IV to PO    59 yo male ordered pantoprazole 40mg  IV Q12hr.    Patient has diet ordered and is taking other medications by mouth. Will continue patient on pantoprazole 40mg  PO BIDac.

## 2015-08-10 NOTE — Progress Notes (Signed)
Physical Therapy Treatment Patient Details Name: James Finley MRN: [id number] DOB: December 22, 1955 Today's Date: 08/10/2015    History of Present Illness Pt presents with L TKR on 08/07/15.    PT Comments    Pt SBA with transfers and gait with RW.  Pt's L knee buckling attempting stairs so L KI donned and with practice pt progressed from 2 assist to 1 assist navigating stairs with L railing.  Pt's HR 101-107 bpm at rest and increased to 135-137 bpm with activity.  MD Hooten notified of pt's difficulty with stairs and increased HR with activity.  Plan for family training for stairs prior to discharge (with use of L KI) to ensure safe discharge (pt still requesting to discharge home today).   Follow Up Recommendations  Home health PT;Supervision for mobility/OOB (assist for stairs)     Equipment Recommendations  Rolling walker with 5" wheels    Recommendations for Other Services       Precautions / Restrictions Precautions Precautions: Knee;Fall Precaution Booklet Issued: Yes (comment) Precaution Comments: L KI for stairs Restrictions Weight Bearing Restrictions: Yes LLE Weight Bearing: Weight bearing as tolerated    Mobility  Bed Mobility               General bed mobility comments: deferred d/t pt sitting up in chair already  Transfers Overall transfer level: Needs assistance Equipment used: Rolling walker (2 wheeled) Transfers: Sit to/from Stand Sit to Stand: Supervision            Ambulation/Gait Ambulation/Gait assistance: Supervision;Min guard Ambulation Distance (Feet): 100 Feet Assistive device: Rolling walker (2 wheeled) Gait Pattern/deviations: Step-to pattern;Decreased step length - left;Decreased stance time - left;Antalgic Gait velocity: decreased   General Gait Details: occasional vc's required for TKE in L stance phase   Stairs Stairs: Yes Stairs assistance:  (min to mod assist x2 first trial; min assist x1 2nd trial) Stair Management: One  rail Left;Sideways;Step to pattern Number of Stairs:  (7 stairs x2 trials) General stair comments: pt unable to perform stairs initially d/t L knee buckling; L KI donned and pt able to perform 1st trial with 2 assist and 2nd trial with 1 assist; vc's and demo required for correct technique  Wheelchair Mobility    Modified Rankin (Stroke Patients Only)       Balance Overall balance assessment: Needs assistance Sitting-balance support: Bilateral upper extremity supported;Feet supported Sitting balance-Leahy Scale: Good     Standing balance support: Bilateral upper extremity supported (on RW) Standing balance-Leahy Scale: Good                      Cognition Arousal/Alertness: Awake/alert Behavior During Therapy: WFL for tasks assessed/performed Overall Cognitive Status: Within Functional Limits for tasks assessed                      Exercises Total Joint Exercises Goniometric ROM: L knee extension 14 degrees short of neutral semi-supine; L knee flexion 90 degrees in sitting    General Comments General comments (skin integrity, edema, etc.): some bloody drainage noted in dressing  Nursing cleared pt for participation in physical therapy.  Pt agreeable to PT session.      Pertinent Vitals/Pain Pain Assessment: 0-10 Pain Score: 7  Pain Location: L knee Pain Descriptors / Indicators: Constant;Sore;Aching;Tender Pain Intervention(s): Limited activity within patient's tolerance;Monitored during session;Premedicated before session;Repositioned;Ice applied    Home Living  Prior Function            PT Goals (current goals can now be found in the care plan section) Acute Rehab PT Goals Patient Stated Goal: to go home PT Goal Formulation: With patient Time For Goal Achievement: 08/21/15 Potential to Achieve Goals: Good Additional Goals Additional Goal #1: Pt will be able to perform bed mobiltiy/transfers with independence in  order to improve functional mobility,. Progress towards PT goals: Progressing toward goals    Frequency  BID    PT Plan Current plan remains appropriate    Co-evaluation             End of Session Equipment Utilized During Treatment: Gait belt Activity Tolerance: Patient limited by pain Patient left: in chair;with call bell/phone within reach;with chair alarm set;with SCD's reapplied (B heels elevated via towel roll; polar care in place)     Time: 0920-1020 PT Time Calculation (min) (ACUTE ONLY): 60 min  Charges:  $Gait Training: 23-37 mins $Therapeutic Exercise: 8-22 mins $Therapeutic Activity: 8-22 mins                    G CodesHendricks Finley:      James Finley 08/10/2015, 10:49 AM James Finley, PT [phone]

## 2015-08-10 NOTE — Progress Notes (Signed)
  Subjective: 3 Days Post-Op Procedure(s) (LRB): TOTAL KNEE ARTHROPLASTY (Left) Patient reports pain as mild.   Patient is well, but has recent complaints of tachycardia and N/V. Plan is to go home with homehealth PT after hospital stay. Negative for chest pain and shortness of breath Fever: no Gastrointestinal:Negative for nausea and vomiting this AM.  Objective: Vital signs in last 24 hours: Temp:  [98.3 F (36.8 C)-99.8 F (37.7 C)] 98.8 F (37.1 C) (11/25 0716) Pulse Rate:  [70-122] 107 (11/25 0716) Resp:  [16-18] 18 (11/25 0716) BP: (114-144)/(67-97) 125/82 mmHg (11/25 0716) SpO2:  [94 %-98 %] 98 % (11/25 0716)  Intake/Output from previous day:  Intake/Output Summary (Last 24 hours) at 08/10/15 0818 Last data filed at 08/10/15 0424  Gross per 24 hour  Intake 1473.33 ml  Output   2400 ml  Net -926.67 ml    Intake/Output this shift:    Labs:  Recent Labs  08/07/15 1125 08/08/15 0441 08/09/15 0508  HGB 12.5* 11.5* 11.3*    Recent Labs  08/08/15 0441 08/09/15 0508  WBC 10.0 11.6*  RBC 3.96* 3.87*  HCT 35.5* 34.1*  PLT 172 164    Recent Labs  08/08/15 0441 08/09/15 0508  NA 135 132*  K 3.9 4.0  CL 104 100*  CO2 26 26  BUN 11 11  CREATININE 0.66 0.56*  GLUCOSE 145* 151*  CALCIUM 8.0* 8.3*   No results for input(s): LABPT, INR in the last 72 hours.   EXAM General - Patient is Alert, Appropriate and Oriented Extremity - ABD soft Neurovascular intact Sensation intact distally Dorsiflexion/Plantar flexion intact Incision: dressing C/D/I No cellulitis present Dressing/Incision - clean, dry, no drainage Motor Function - intact, moving foot and toes well on exam.   Abdomen soft, normal bowel sounds, no tympany. Heart auscultation reveals patient is still tachycardic. Regular rhythm.  Past Medical History  Diagnosis Date  . Hypercholesteremia unk  . Arthritis   . Diabetes mellitus without complication (HCC)     diet controlled     Assessment/Plan: 3 Days Post-Op Procedure(s) (LRB): TOTAL KNEE ARTHROPLASTY (Left) Active Problems:   Primary osteoarthritis of left knee  Estimated body mass index is 23.1 kg/(m^2) as calculated from the following:   Height as of this encounter: 6\' 2"  (1.88 m).   Weight as of this encounter: 81.647 kg (180 lb). Advance diet Up with therapy   Recent vitals show the HR to be 107 this am.  WBC up to 11.6 this morning.  Not complaining of cough or painful urination.  He denies any shortness of breath this AM. N/V better controlled this AM.  Pt tolerating PO diet. Pt states that he has had a small BM yesterday.  I have confirmed this with the nursing staff. Plan on discharge home today with home-health PT.  Want pt to follow-up with PCP shortly after discharge for tachycardia.  DVT Prophylaxis - Lovenox, Foot Pumps and TED hose Weight-Bearing as tolerated to left leg  J. Horris LatinoLance Davien Malone, PA-C [facility] 08/10/2015, 8:18 AM

## 2015-08-10 NOTE — Progress Notes (Signed)
PT Cancellation Note  Patient Details Name: James Finley MRN: [id number] DOB: 07/26/1956   Cancelled Treatment:    Reason Eval/Treat Not Completed: Other (comment) (Pt discharged.)  Pt was pending doppler to evaluate for LE DVT so unable to see pt earlier in afternoon.  After imaging noted to be negative for DVT later in afternoon, PT attempted to see pt for family training for stairs for safe discharge home but pt was already discharged and gone (PT had notified nursing and pt in morning of need to perform stairs training with family prior to discharge and pt's family notified in afternoon of this when PT had stopped to check on pt but pt had been gone for imaging).  Irving Burtonmily Genette Huertas 08/10/2015, 4:35 PM Hendricks LimesEmily Jonnie Truxillo, PT [phone]

## 2015-08-10 NOTE — H&P (Signed)
Medical Consultation  James FailRickey Finley XBJ:[id number] DOB: 12/13/1955 DOA: 08/07/2015 PCP: James Finley   Requesting physician: James Finley Date of consultation: 08/10/2015 Reason for consultation: Sinus tachycardia  Impression/Recommendations  59 year old male postop day #3 with left TKR who was asked to be seen and evaluated for sinus tachycardia on ambulation.  1. Sinus tachycardia: I suspect the sinus tachycardia is more than likely related to pain associated with ambulation. His heart rate does improve when he sits down. He does report that he has increased pain on ambulation. He has low risk of PE/DVT due to the fact that patient has been on anticoagulation since surgery and he is currently denying chest pain or shortness of breath and is not hypoxic. He does have some swelling in the left lower extremity which is likely related to the surgery, however I will order lower external he Doppler to evaluate for DVT.  If this is negative I suspect patient can be discharged either today or tomorrow depending on the level of pain. His creatinine is normal so I do not suspect dehydration as a part of sinus tachycardia.   2. Hyperlipidemia: Continue patient medications.  3. Diet-controlled diabetes: Continue to follow up with his PCP.  Chief Complaint: Elevated heart rate  HPI:   This is a very pleasant 59 year old male with a history of diet-controlled diabetes and hyperlipidemia who is postoperative day #3 for left TKR. Physical therapy noted patient's heart rate to be in the 130s on ambulation. Patient reports that when he is ambulating he is having increased pain. It is noted that his heart rate does improve when the patient sits down and rest. Patient denies chest pain, shortness of breath or nausea.  Review of Systems  Constitutional: Negative for fever, chills weight loss HENT: Negative for ear pain, nosebleeds, congestion, facial swelling, rhinorrhea, neck pain, neck stiffness  and ear discharge.   Respiratory: Negative for cough, shortness of breath, wheezing  Cardiovascular: Negative for chest pain, palpitations and leg swelling.  Gastrointestinal: Negative for heartburn, abdominal pain, vomiting, diarrhea or consitpation Genitourinary: Negative for dysuria, urgency, frequency, hematuria Musculoskeletal: Negative for back pain ++ pain on ambulation left knee Neurological: Negative for dizziness, seizures, syncope, focal weakness,  numbness and headaches.  Hematological: Does not bruise/bleed easily.  Psychiatric/Behavioral: Negative for hallucinations, confusion, dysphoric mood   Past Medical History  Diagnosis Date  . Hypercholesteremia unk  . Arthritis   . Diabetes mellitus without complication (HCC)     diet controlled   Past Surgical History  Procedure Laterality Date  . Joint replacement Left     hip  . Fracture surgery Right     leg  . Hernia repair     Social History:  reports that he has quit smoking. He has never used smokeless tobacco. He reports that he does not drink alcohol or use illicit drugs.  Allergies  Allergen Reactions  . Percocet [Oxycodone-Acetaminophen] Nausea And Vomiting   History reviewed. No pertinent family history.  Prior to Admission medications   Medication Sig Start Date End Date Taking? Authorizing Provider  pravastatin (PRAVACHOL) 20 MG tablet Take 20 mg by mouth daily.   Yes Historical Provider, Finley  enoxaparin (LOVENOX) 40 MG/0.4ML injection Inject 0.4 mLs (40 mg total) into the skin daily. 08/10/15   James OregonJames Lance McGhee, Finley  Glucosamine-Chondroitin (OSTEO BI-FLEX REGULAR STRENGTH PO) Take 1 tablet by mouth daily.    Historical Provider, Finley  HYDROcodone-acetaminophen (NORCO/VICODIN) 5-325 MG tablet Take 1-2 tablets by mouth every 4 (four) hours  as needed for moderate pain. 08/10/15   James Oregon, Finley  Hypromellose (ARTIFICIAL TEARS OP) Apply to eye.    Historical Provider, Finley  meloxicam (MOBIC) 7.5 MG  tablet Take 7.5 mg by mouth daily.    Historical Provider, Finley  naproxen sodium (ANAPROX) 220 MG tablet Take 220 mg by mouth 2 (two) times daily as needed.    Historical Provider, Finley  ondansetron (ZOFRAN) 4 MG tablet Take 1 tablet (4 mg total) by mouth every 6 (six) hours as needed for nausea. 08/09/15   James Finley  oxyCODONE (OXY IR/ROXICODONE) 5 MG immediate release tablet Take 1-2 tablets (5-10 mg total) by mouth every 4 (four) hours as needed for breakthrough pain. 08/09/15   James Finley    Physical Exam: Blood pressure 125/82, pulse 137, temperature 98.8 F (37.1 C), temperature source Oral, resp. rate 18, height  (1.88 m), weight 81.647 kg (180 lb), SpO2 96 %. @ [employer]   08/07/15 0608  Weight: 81.647 kg (180 lb)    Intake/Output Summary (Last 24 hours) at 08/10/15 1250 Last data filed at 08/10/15 0900  Gross per 24 hour  Intake 1953.33 ml  Output   1950 ml  Net   3.33 ml     Constitutional: Appears well-developed and well-nourished. No distress. HENT: Normocephalic. Marland Kitchen Oropharynx is clear and moist.  Eyes: Conjunctivae and EOM are normal. PERRLA, no scleral icterus.  Neck: Normal ROM. Neck supple. No JVD. No tracheal deviation. CVS: RRR, S1/S2 +, no murmurs, no gallops, no carotid bruit.  Pulmonary: Effort and breath sounds normal, no stridor, rhonchi, wheezes, rales.  Abdominal: Soft. BS +,  no distension, tenderness, rebound or guarding.  Musculoskeletal: Normal range of motion. Mild LLE but no tenderness.  Neuro: Alert. CN 2-12 grossly intact. No focal deficits. Skin: Skin is warm and dry. No rash noted. Psychiatric: Normal mood and affect.    Labs  Basic Metabolic Panel:  Recent Labs Lab 08/09/15 0508  NA 132*  K 4.0  CL 100*  CO2 26  GLUCOSE 151*  BUN 11  CREATININE 0.56*  CALCIUM 8.3*   Liver Function Tests: No results for input(s): AST, ALT, ALKPHOS, BILITOT, PROT, ALBUMIN in the last 168 hours. No results for  input(s): LIPASE, AMYLASE in the last 168 hours.  CBC:  Recent Labs Lab 08/09/15 0508  WBC 11.6*  HGB 11.3*  HCT 34.1*  MCV 88.1  PLT 164   Cardiac Enzymes: No results for input(s): CKTOTAL, CKMB, CKMBINDEX, TROPONINI in the last 168 hours. BNP: Invalid input(s): POCBNP CBG:  Recent Labs Lab 08/07/15 0956  GLUCAP 111*    Radiological Exams: No results found.  EKG:  Sinus tachycardia no ST elevation or depression   Thank you for allowing me to participate in the care of your patient. We will continue to follow.   Note: This dictation was prepared with Dragon dictation along with smaller phrase technology. Any transcriptional errors that result from this process are unintentional.  Time spent: 45  Tyyonna Soucy, Finley

## 2015-08-13 ENCOUNTER — Encounter: Payer: Self-pay | Admitting: Orthopedic Surgery

## 2015-08-14 ENCOUNTER — Encounter: Payer: Self-pay | Admitting: Orthopedic Surgery

## 2015-09-06 ENCOUNTER — Encounter
Admission: RE | Disposition: A | Discharge status: Home / Self Care | Payer: Self-pay | Source: Ambulatory Visit | Attending: Orthopedic Surgery

## 2015-09-06 ENCOUNTER — Encounter: Payer: Self-pay | Admitting: *Deleted

## 2015-09-06 ENCOUNTER — Ambulatory Visit: Payer: 59 | Admitting: Anesthesiology

## 2015-09-06 ENCOUNTER — Ambulatory Visit
Admission: RE | Admit: 2015-09-06 | Discharge: 2015-09-06 | Disposition: A | Discharge status: Home / Self Care | Payer: 59 | Source: Ambulatory Visit | Attending: Orthopedic Surgery | Admitting: Orthopedic Surgery

## 2015-09-06 DIAGNOSIS — E785 Hyperlipidemia, unspecified: Secondary | ICD-10-CM | POA: Insufficient documentation

## 2015-09-06 DIAGNOSIS — M24562 Contracture, left knee: Secondary | ICD-10-CM | POA: Insufficient documentation

## 2015-09-06 DIAGNOSIS — Y834 Other reconstructive surgery as the cause of abnormal reaction of the patient, or of later complication, without mention of misadventure at the time of the procedure: Secondary | ICD-10-CM | POA: Insufficient documentation

## 2015-09-06 DIAGNOSIS — Z96642 Presence of left artificial hip joint: Secondary | ICD-10-CM | POA: Insufficient documentation

## 2015-09-06 DIAGNOSIS — Z886 Allergy status to analgesic agent status: Secondary | ICD-10-CM | POA: Diagnosis not present

## 2015-09-06 DIAGNOSIS — E119 Type 2 diabetes mellitus without complications: Secondary | ICD-10-CM | POA: Insufficient documentation

## 2015-09-06 DIAGNOSIS — Y92234 Operating room of hospital as the place of occurrence of the external cause: Secondary | ICD-10-CM | POA: Insufficient documentation

## 2015-09-06 DIAGNOSIS — Z79899 Other long term (current) drug therapy: Secondary | ICD-10-CM | POA: Diagnosis not present

## 2015-09-06 DIAGNOSIS — Y792 Prosthetic and other implants, materials and accessory orthopedic devices associated with adverse incidents: Secondary | ICD-10-CM | POA: Insufficient documentation

## 2015-09-06 DIAGNOSIS — M24662 Ankylosis, left knee: Secondary | ICD-10-CM | POA: Insufficient documentation

## 2015-09-06 DIAGNOSIS — Z885 Allergy status to narcotic agent status: Secondary | ICD-10-CM | POA: Insufficient documentation

## 2015-09-06 HISTORY — PX: KNEE CLOSED REDUCTION: SHX995

## 2015-09-06 LAB — GLUCOSE, CAPILLARY
Glucose-Capillary: 116 mg/dL — ABNORMAL HIGH (ref 65–99)
Glucose-Capillary: 91 mg/dL (ref 65–99)

## 2015-09-06 SURGERY — MANIPULATION, KNEE, CLOSED
Anesthesia: General | Laterality: Left

## 2015-09-06 MED ORDER — LIDOCAINE HCL (CARDIAC) 20 MG/ML IV SOLN
INTRAVENOUS | Status: DC | PRN
  Administered 2015-09-06: 100 mg via INTRAVENOUS

## 2015-09-06 MED ORDER — LABETALOL HCL 5 MG/ML IV SOLN
INTRAVENOUS | Status: AC
  Administered 2015-09-06: 5 mg via INTRAVENOUS
  Filled 2015-09-06: qty 4

## 2015-09-06 MED ORDER — LACTATED RINGERS IV SOLN
INTRAVENOUS | Status: DC
  Administered 2015-09-06: 14:00:00 via INTRAVENOUS

## 2015-09-06 MED ORDER — HYDROCODONE-ACETAMINOPHEN 5-325 MG PO TABS: 1.0000 | ORAL_TABLET | Freq: Four times a day (QID) | ORAL | Status: DC | PRN

## 2015-09-06 MED ORDER — LABETALOL HCL 5 MG/ML IV SOLN
5.0000 mg | Freq: Once | INTRAVENOUS | Status: AC
  Administered 2015-09-06: 5 mg via INTRAVENOUS

## 2015-09-06 MED ORDER — PROPOFOL 10 MG/ML IV BOLUS
INTRAVENOUS | Status: DC | PRN
  Administered 2015-09-06: 200 mg via INTRAVENOUS

## 2015-09-06 SURGICAL SUPPLY — 1 items: KIT RM TURNOVER STRD PROC AR (KITS) IMPLANT

## 2015-09-06 NOTE — Discharge Instructions (Addendum)
Work with physical therapy to regain motion. Flexion and extension. Polar Care at home to minimize swelling today and tomorrow. Weight-bear as tolerated on left leg  KEEP APPT WITH PT TOMORROW!!!  AMBULATORY SURGERY  DISCHARGE INSTRUCTIONS   1) The drugs that you were given will stay in your system until tomorrow so for the next 24 hours you should not:  A) Drive an automobile B) Make any legal decisions C) Drink any alcoholic beverage   2) You may resume regular meals tomorrow.  Today it is better to start with liquids and gradually work up to solid foods.  You may eat anything you prefer, but it is better to start with liquids, then soup and crackers, and gradually work up to solid foods.   3) Please notify your doctor immediately if you have any unusual bleeding, trouble breathing, redness and pain at the surgery site, drainage, fever, or pain not relieved by medication.    4) Additional Instructions:        Please contact your physician with any problems or Same Day Surgery at [phone], Monday through Friday 6 am to 4 pm, or [place] at [facility] Main number at [phone].

## 2015-09-06 NOTE — Anesthesia Postprocedure Evaluation (Signed)
Anesthesia Post Note  Patient: James Finley  Procedure(s) Performed: Procedure(s) (LRB): CLOSED MANIPULATION KNEE (Left)  Patient location during evaluation: PACU Anesthesia Type: General Level of consciousness: awake Pain management: satisfactory to patient Vital Signs Assessment: post-procedure vital signs reviewed and stable Respiratory status: spontaneous breathing Cardiovascular status: stable Anesthetic complications: no    Last Vitals:  Filed Vitals:   09/06/15 1419 09/06/15 1435  BP: 138/104 142/99  Pulse: 112 81  Temp: 36.2 C   Resp: 21 14    Last Pain:  Filed Vitals:   09/06/15 1440  PainSc: 4                  VAN STAVEREN,Kennya Schwenn

## 2015-09-06 NOTE — H&P (Signed)
Reviewed paper H+P, will be scanned into chart. No changes noted.  

## 2015-09-06 NOTE — Transfer of Care (Signed)
Immediate Anesthesia Transfer of Care Note  Patient: James Finley  Procedure(s) Performed: Procedure(s): CLOSED MANIPULATION KNEE (Left)  Patient Location: PACU  Anesthesia Type:General  Level of Consciousness: awake  Airway & Oxygen Therapy: Patient Spontanous Breathing  Post-op Assessment: Report given to RN and Post -op Vital signs reviewed and stable  Post vital signs: Reviewed and stable  Last Vitals:  Filed Vitals:   09/06/15 1320 09/06/15 1419  BP: 146/97 138/104  Pulse: 89 112  Temp: 36.8 C 36.2 C  Resp: 16 21    Complications: No apparent anesthesia complications

## 2015-09-06 NOTE — Op Note (Signed)
09/06/2015  2:21 PM  PATIENT:  Domenica Failickey Ivey  59 y.o. male  PRE-OPERATIVE DIAGNOSIS:  ARTHROFIBROSIS left knee  POST-OPERATIVE DIAGNOSIS:  arthrofibrosis left knee  PROCEDURE:  Procedure(s): CLOSED MANIPULATION KNEE (Left)  SURGEON: Leitha SchullerMichael J Zairah Arista, MD  ASSISTANTS: None  ANESTHESIA:   general  EBL:  Total I/O In: 500 [I.V.:500] Out: -   BLOOD ADMINISTERED:none  DRAINS: none   LOCAL MEDICATIONS USED:  NONE  SPECIMEN:  No Specimen  DISPOSITION OF SPECIMEN:  N/A  COUNTS:  NO Nothing open so no counts required  TOURNIQUET:  * No tourniquets in log *  IMPLANTS: None  DICTATION: .Dragon Dictation patient brought the operating room and after adequate and anesthesia was obtained, appropriate patient education timeout procedure completed. The leg was brought up into flexion and past with passive motion the flexed knee was at 80-85 of flexion pressure was applied to the proximal tibia and with gentle pressure approximately 110 of flexion was obtained without any audible popping. At the start of the case there was also about a  10 of flexion contracture. With passive extension. Extension could be obtained  PLAN OF CARE: Discharge to home after PACU  PATIENT DISPOSITION:  PACU - hemodynamically stable.

## 2015-09-06 NOTE — Anesthesia Preprocedure Evaluation (Signed)
Anesthesia Evaluation  Patient identified by MRN, date of birth, ID band Patient awake    Reviewed: Allergy & Precautions, NPO status , Patient's Chart, lab work & pertinent test results  Airway Mallampati: II       Dental no notable dental hx.    Pulmonary neg pulmonary ROS, former smoker,    + rhonchi        Cardiovascular Exercise Tolerance: Good  Rhythm:Regular     Neuro/Psych    GI/Hepatic negative GI ROS, Neg liver ROS,   Endo/Other  diabetes  Renal/GU negative Renal ROS     Musculoskeletal   Abdominal Normal abdominal exam  (+)   Peds negative pediatric ROS (+)  Hematology   Anesthesia Other Findings   Reproductive/Obstetrics                             Anesthesia Physical Anesthesia Plan  ASA: II  Anesthesia Plan: General   Post-op Pain Management:    Induction: Intravenous  Airway Management Planned: Mask  Additional Equipment:   Intra-op Plan:   Post-operative Plan:   Informed Consent: I have reviewed the patients History and Physical, chart, labs and discussed the procedure including the risks, benefits and alternatives for the proposed anesthesia with the patient or authorized representative who has indicated his/her understanding and acceptance.     Plan Discussed with: CRNA  Anesthesia Plan Comments:         Anesthesia Quick Evaluation

## 2015-09-07 ENCOUNTER — Encounter: Payer: Self-pay | Admitting: Orthopedic Surgery

## 2015-11-02 ENCOUNTER — Encounter: Payer: Self-pay | Admitting: Emergency Medicine

## 2015-11-02 DIAGNOSIS — Z79899 Other long term (current) drug therapy: Secondary | ICD-10-CM | POA: Insufficient documentation

## 2015-11-02 DIAGNOSIS — S0501XA Injury of conjunctiva and corneal abrasion without foreign body, right eye, initial encounter: Secondary | ICD-10-CM | POA: Diagnosis not present

## 2015-11-02 DIAGNOSIS — Y9289 Other specified places as the place of occurrence of the external cause: Secondary | ICD-10-CM | POA: Insufficient documentation

## 2015-11-02 DIAGNOSIS — Y998 Other external cause status: Secondary | ICD-10-CM | POA: Diagnosis not present

## 2015-11-02 DIAGNOSIS — Z87891 Personal history of nicotine dependence: Secondary | ICD-10-CM | POA: Diagnosis not present

## 2015-11-02 DIAGNOSIS — E119 Type 2 diabetes mellitus without complications: Secondary | ICD-10-CM | POA: Diagnosis not present

## 2015-11-02 DIAGNOSIS — Y9389 Activity, other specified: Secondary | ICD-10-CM | POA: Diagnosis not present

## 2015-11-02 DIAGNOSIS — X58XXXA Exposure to other specified factors, initial encounter: Secondary | ICD-10-CM | POA: Insufficient documentation

## 2015-11-02 DIAGNOSIS — Z791 Long term (current) use of non-steroidal anti-inflammatories (NSAID): Secondary | ICD-10-CM | POA: Insufficient documentation

## 2015-11-02 DIAGNOSIS — H5711 Ocular pain, right eye: Secondary | ICD-10-CM | POA: Diagnosis present

## 2015-11-02 NOTE — ED Notes (Signed)
Pt. States intermittent rt. Eye pain for the past couple days.  Pt. States last year he had scratched the same eye.  Pt. States swelling and discharge.

## 2015-11-03 ENCOUNTER — Emergency Department
Admission: EM | Admit: 2015-11-03 | Discharge: 2015-11-03 | Disposition: A | Discharge status: Home / Self Care | Payer: 59 | Attending: Emergency Medicine | Admitting: Emergency Medicine

## 2015-11-03 DIAGNOSIS — H5711 Ocular pain, right eye: Secondary | ICD-10-CM

## 2015-11-03 DIAGNOSIS — S0501XA Injury of conjunctiva and corneal abrasion without foreign body, right eye, initial encounter: Secondary | ICD-10-CM | POA: Diagnosis not present

## 2015-11-03 MED ORDER — TETRACAINE HCL 0.5 % OP SOLN
OPHTHALMIC | Status: AC
  Filled 2015-11-03: qty 2

## 2015-11-03 MED ORDER — TOBRAMYCIN 0.3 % OP SOLN
2.0000 [drp] | OPHTHALMIC | Status: DC
  Administered 2015-11-03: 2 [drp] via OPHTHALMIC
  Filled 2015-11-03: qty 5

## 2015-11-03 MED ORDER — IBUPROFEN 800 MG PO TABS
800.0000 mg | ORAL_TABLET | Freq: Once | ORAL | Status: AC
  Administered 2015-11-03: 800 mg via ORAL
  Filled 2015-11-03: qty 1

## 2015-11-03 MED ORDER — FLUORESCEIN SODIUM 1 MG OP STRP
1.0000 | ORAL_STRIP | Freq: Once | OPHTHALMIC | Status: AC
  Administered 2015-11-03: 1 via OPHTHALMIC

## 2015-11-03 MED ORDER — TOBRAMYCIN 0.3 % OP SOLN: 2.0000 [drp] | OPHTHALMIC | Status: AC

## 2015-11-03 MED ORDER — FLUORESCEIN SODIUM 1 MG OP STRP
ORAL_STRIP | OPHTHALMIC | Status: AC
  Filled 2015-11-03: qty 1

## 2015-11-03 MED ORDER — TETRACAINE HCL 0.5 % OP SOLN
2.0000 [drp] | Freq: Once | OPHTHALMIC | Status: AC
  Administered 2015-11-03: 2 [drp] via OPHTHALMIC

## 2015-11-03 NOTE — ED Provider Notes (Signed)
[facility] Emergency Department Provider Note  ____________________________________________  Time seen: Approximately 2:48 AM  I have reviewed the triage vital signs and the nursing notes.   HISTORY  Chief Complaint Eye Pain    HPI James Finley is a 60 y.o. male who presents to the ED from home with a chief complaint of right eye pain. Patient has noted pain with increased tearing to her right eye for the past 2 days. Symptoms similar to last year when he had a corneal abrasion of the same eye. Reports wearing reading glasses, no contacts. Denies fever, chills, chest pain, shortness breath, abdominal pain, nausea, vomiting, diarrhea. Denies trauma or injury to right eye.Nothing makes his symptoms better or worse.   Past Medical History  Diagnosis Date  . Hypercholesteremia unk  . Arthritis   . Diabetes mellitus without complication (HCC)     diet controlled    Patient Active Problem List   Diagnosis Date Noted  . Primary osteoarthritis of left knee 08/07/2015    Past Surgical History  Procedure Laterality Date  . Joint replacement Left     hip  . Fracture surgery Right     leg  . Hernia repair    . Total knee arthroplasty Left 08/07/2015    Procedure: TOTAL KNEE ARTHROPLASTY;  Surgeon: Kennedy Bucker, MD;  Location: [facility] ORS;  Service: Orthopedics;  Laterality: Left;  . Knee closed reduction Left 09/06/2015    Procedure: CLOSED MANIPULATION KNEE;  Surgeon: Kennedy Bucker, MD;  Location: [facility] ORS;  Service: Orthopedics;  Laterality: Left;    Current Outpatient Rx  Name  Route  Sig  Dispense  Refill  . enoxaparin (LOVENOX) 40 MG/0.4ML injection   Subcutaneous   Inject 0.4 mLs (40 mg total) into the skin daily. Patient not taking: Reported on 09/06/2015   14 Syringe   0   . Glucosamine-Chondroitin (OSTEO BI-FLEX REGULAR STRENGTH PO)   Oral   Take 1 tablet by mouth daily. Reported on 09/06/2015         . HYDROcodone-acetaminophen (NORCO)  5-325 MG tablet   Oral   Take 1-2 tablets by mouth every 6 (six) hours as needed for moderate pain.   50 tablet   0   . HYDROcodone-acetaminophen (NORCO/VICODIN) 5-325 MG tablet   Oral   Take 1-2 tablets by mouth every 4 (four) hours as needed for moderate pain.   60 tablet   0   . Hypromellose (ARTIFICIAL TEARS OP)   Ophthalmic   Apply to eye.         . meloxicam (MOBIC) 7.5 MG tablet   Oral   Take 7.5 mg by mouth daily.         . naproxen sodium (ANAPROX) 220 MG tablet   Oral   Take 220 mg by mouth 2 (two) times daily as needed.         . ondansetron (ZOFRAN) 4 MG tablet   Oral   Take 1 tablet (4 mg total) by mouth every 6 (six) hours as needed for nausea.   20 tablet   0   . oxyCODONE (OXY IR/ROXICODONE) 5 MG immediate release tablet   Oral   Take 1-2 tablets (5-10 mg total) by mouth every 4 (four) hours as needed for breakthrough pain. Patient not taking: Reported on 09/06/2015   60 tablet   0   . pravastatin (PRAVACHOL) 20 MG tablet   Oral   Take 20 mg by mouth daily.  Allergies Percocet  History reviewed. No pertinent family history.  Social History Social History  Substance Use Topics  . Smoking status: Former Games developer  . Smokeless tobacco: Never Used  . Alcohol Use: No    Review of Systems Constitutional: No fever/chills Eyes: Positive for right eye pain, irritation and tearing. No visual changes. ENT: No sore throat. Cardiovascular: Denies chest pain. Respiratory: Denies shortness of breath. Gastrointestinal: No abdominal pain.  No nausea, no vomiting.  No diarrhea.  No constipation. Genitourinary: Negative for dysuria. Musculoskeletal: Negative for back pain. Skin: Negative for rash. Neurological: Negative for headaches, focal weakness or numbness.  10-point ROS otherwise negative.  ____________________________________________   PHYSICAL EXAM:  VITAL SIGNS: ED Triage Vitals  Enc Vitals Group     BP 11/02/15 2319  144/86 mmHg     Pulse Rate 11/02/15 2319 85     Resp 11/02/15 2319 18     Temp 11/02/15 2319 98 F (36.7 C)     Temp Source 11/02/15 2319 Oral     SpO2 11/02/15 2319 98 %     Weight 11/02/15 2319 180 lb (81.647 kg)     Height 11/02/15 2319  (1.854 m)     Head Cir --      Peak Flow --      Pain Score 11/02/15 2320 2     Pain Loc --      Pain Edu? --      Excl. in GC? --     Constitutional: Alert and oriented. Well appearing and in no acute distress. Eyes: Right conjunctiva reddened. PERRL. EOMI. Visual acuity noted 20/40 OD, 20/30 OS. There is no globe rupture. Right eyelid everted for inspection; no FB noted. Tetracaine applied and fluorescein strip stain to right eye and examined under Wood lamp: Small corneal abrasion to medial aspect. No hyphema. Posterior segments within normal limits. Head: Atraumatic. Nose: No congestion/rhinnorhea. Mouth/Throat: Mucous membranes are moist.  Oropharynx non-erythematous. Neck: No stridor.   Cardiovascular: Normal rate, regular rhythm. Grossly normal heart sounds.  Good peripheral circulation. Respiratory: Normal respiratory effort.  No retractions. Lungs CTAB. Gastrointestinal: Soft and nontender. No distention. No abdominal bruits. No CVA tenderness. Musculoskeletal: No lower extremity tenderness nor edema.  No joint effusions. Neurologic:  Normal speech and language. No gross focal neurologic deficits are appreciated. No gait instability. Skin:  Skin is warm, dry and intact. No rash noted. Psychiatric: Mood and affect are normal. Speech and behavior are normal.  ____________________________________________   LABS (all labs ordered are listed, but only abnormal results are displayed)  Labs Reviewed - No data to display ____________________________________________  EKG  None ____________________________________________  RADIOLOGY  None ____________________________________________   PROCEDURES  Procedure(s) performed: None    Critical Care performed: No  ____________________________________________   INITIAL IMPRESSION / ASSESSMENT AND PLAN / ED COURSE  Pertinent labs & imaging results that were available during my care of the patient were reviewed by me and considered in my medical decision making (see chart for details).  60 year old male who presents with right eye irritation and tearing secondary to abrasion. Will initiate treatment with Tobrex antibiotic eye drops, ibuprofen as needed for discomfort and close follow-up with ophthalmology. Strict return precautions given. Patient verbalizes understanding and agrees with plan of care. __________________________________________   FINAL CLINICAL IMPRESSION(S) / ED DIAGNOSES  Final diagnoses:  Eye pain, right  Corneal abrasion, right, initial encounter      Irean Hong, MD 11/03/15 0710

## 2015-11-03 NOTE — Discharge Instructions (Signed)
1. Take ibuprofen as needed for discomfort. 2. Apply antibiotic eye drops - 2 drops to right eye every 4 hours while awake 7 days. 3. Return to the ER for worsening symptoms, persistent vomiting, difficulty breathing or other concerns.  Corneal Abrasion The cornea is the clear covering at the front and center of the eye. When looking at the colored portion of the eye (iris), you are looking through the cornea. This very thin tissue is made up of many layers. The surface layer is a single layer of cells (corneal epithelium) and is one of the most sensitive tissues in the body. If a scratch or injury causes the corneal epithelium to come off, it is called a corneal abrasion. If the injury extends to the tissues below the epithelium, the condition is called a corneal ulcer. CAUSES   Scratches.  Trauma.  Foreign body in the eye. Some people have recurrences of abrasions in the area of the original injury even after it has healed (recurrent erosion syndrome). Recurrent erosion syndrome generally improves and goes away with time. SYMPTOMS   Eye pain.  Difficulty or inability to keep the injured eye open.  The eye becomes very sensitive to light.  Recurrent erosions tend to happen suddenly, first thing in the morning, usually after waking up and opening the eye. DIAGNOSIS  Your health care provider can diagnose a corneal abrasion during an eye exam. Dye is usually placed in the eye using a drop or a small paper strip moistened by your tears. When the eye is examined with a special light, the abrasion shows up clearly because of the dye. TREATMENT   Small abrasions may be treated with antibiotic drops or ointment alone.  A pressure patch may be put over the eye. If this is done, follow your doctor's instructions for when to remove the patch. Do not drive or use machines while the eye patch is on. Judging distances is hard to do with a patch on. If the abrasion becomes infected and spreads to  the deeper tissues of the cornea, a corneal ulcer can result. This is serious because it can cause corneal scarring. Corneal scars interfere with light passing through the cornea and cause a loss of vision in the involved eye. HOME CARE INSTRUCTIONS  Use medicine or ointment as directed. Only take over-the-counter or prescription medicines for pain, discomfort, or fever as directed by your health care provider.  Do not drive or operate machinery if your eye is patched. Your ability to judge distances is impaired.  If your health care provider has given you a follow-up appointment, it is very important to keep that appointment. Not keeping the appointment could result in a severe eye infection or permanent loss of vision. If there is any problem keeping the appointment, let your health care provider know. SEEK MEDICAL CARE IF:   You have pain, light sensitivity, and a scratchy feeling in one eye or both eyes.  Your pressure patch keeps loosening up, and you can blink your eye under the patch after treatment.  Any kind of discharge develops from the eye after treatment or if the lids stick together in the morning.  You have the same symptoms in the morning as you did with the original abrasion days, weeks, or months after the abrasion healed.   This information is not intended to replace advice given to you by your health care provider. Make sure you discuss any questions you have with your health care provider.   Document  Released: 08/29/2000 Document Revised: 05/23/2015 Document Reviewed: 05/09/2013 Elsevier Interactive Patient Education [employer]

## 2016-01-26 IMAGING — US US EXTREM LOW VENOUS BILAT
1 series · 13 of 24 positions shown · non-contrast
Comparison: No prior exams.

CLINICAL DATA: Bilateral lower extremity swelling.



[Series 1: us extrem low venous bilat · 0.07mm/px · 13 of 57 slices shown]
[im 1/57]
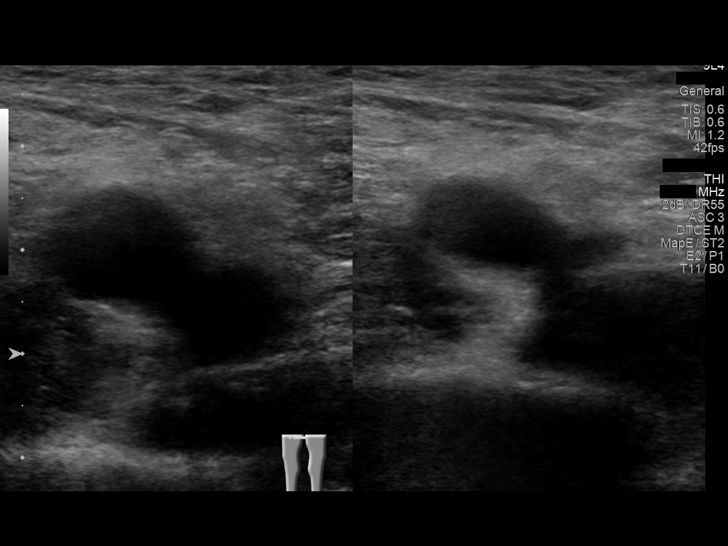
[im 5/57]
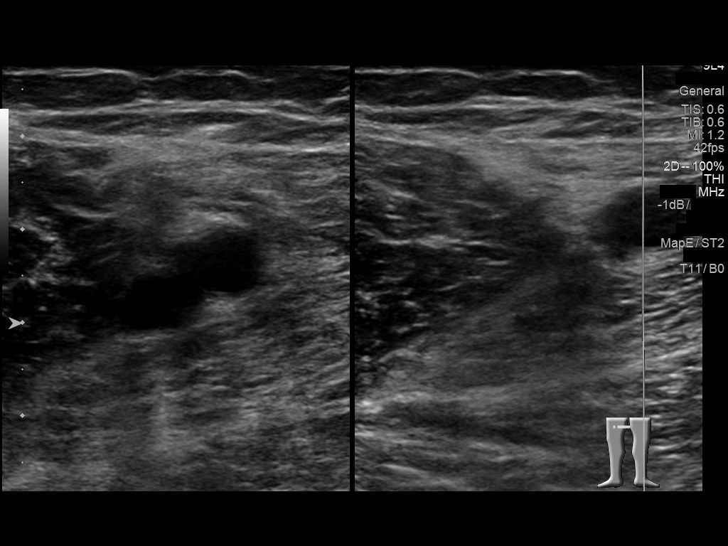
[im 10/57]
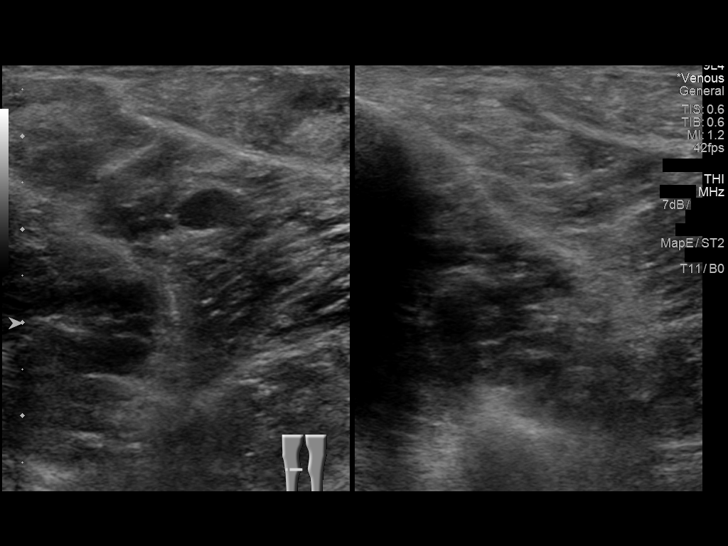
[im 15/57]
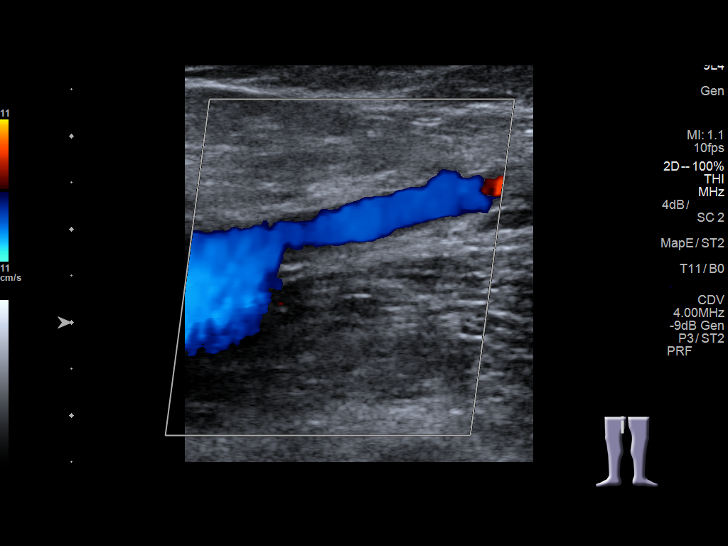
[im 20/57]
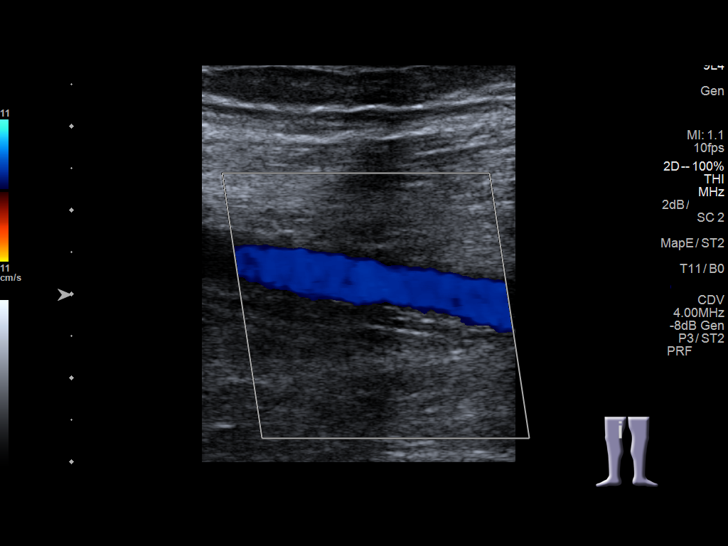
[im 25/57]
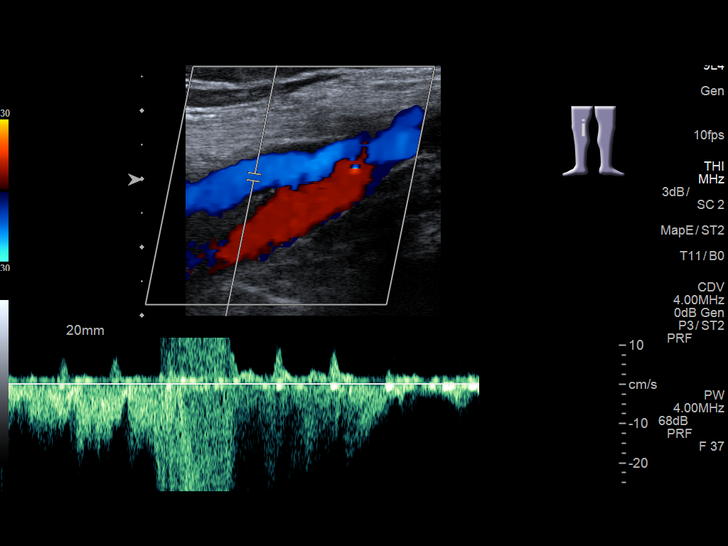
[im 30/57]
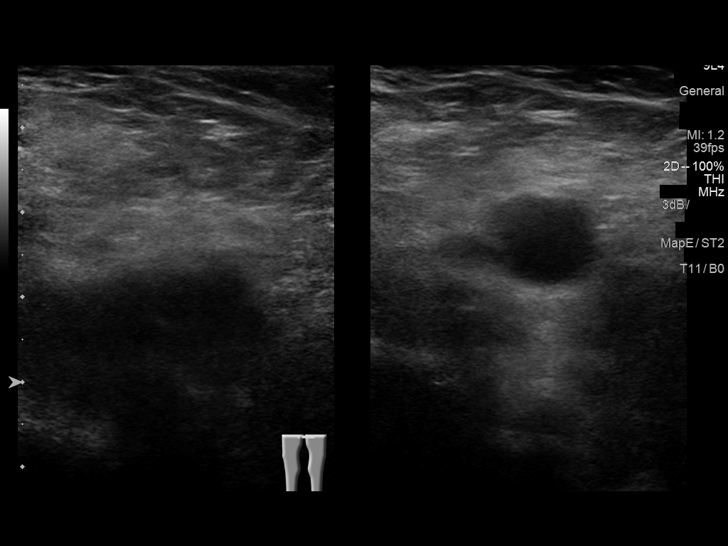
[im 32/57]
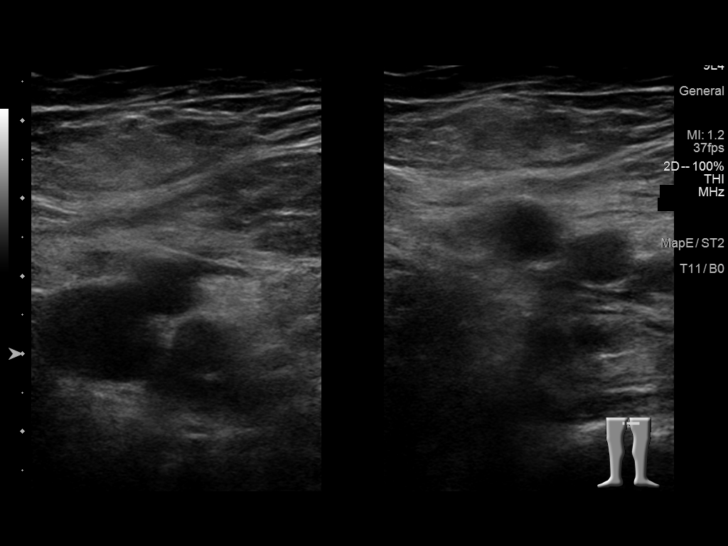
[im 37/57]
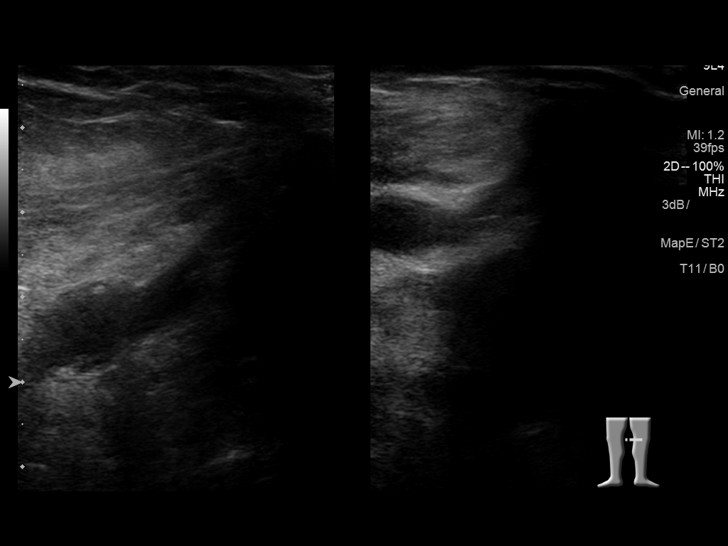
[im 42/57]
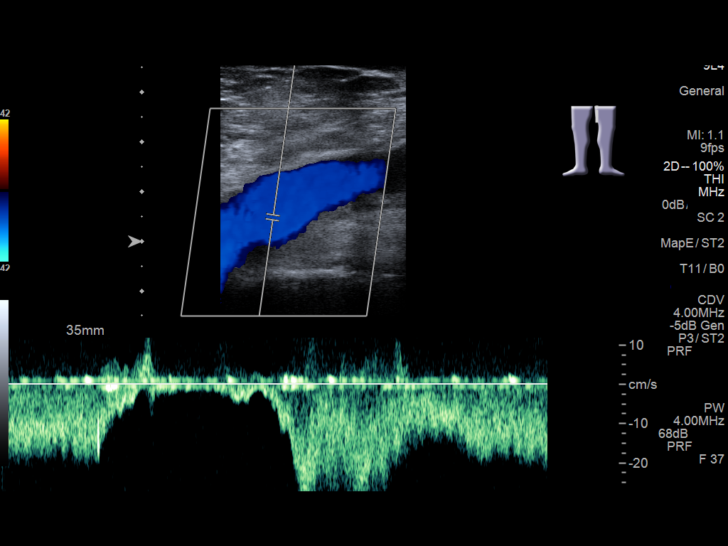
[im 47/57]
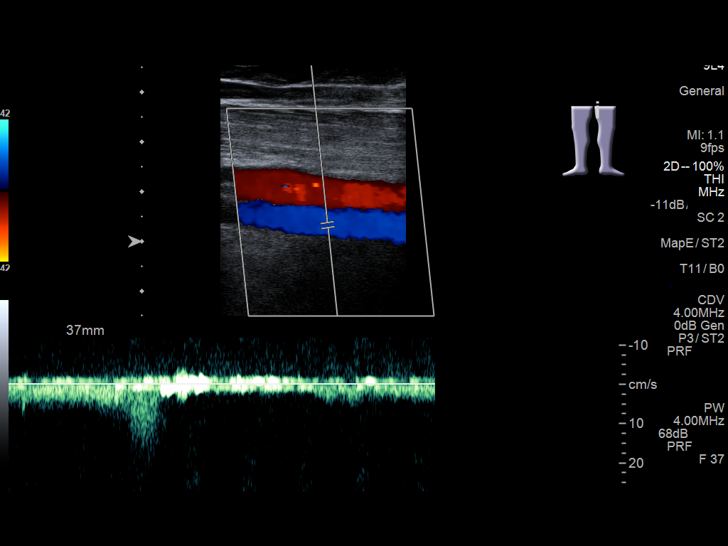
[im 52/57]
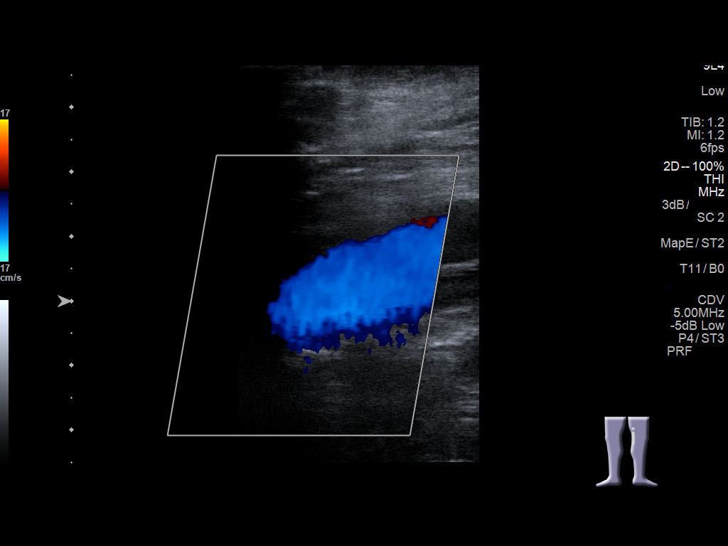
[im 57/57]
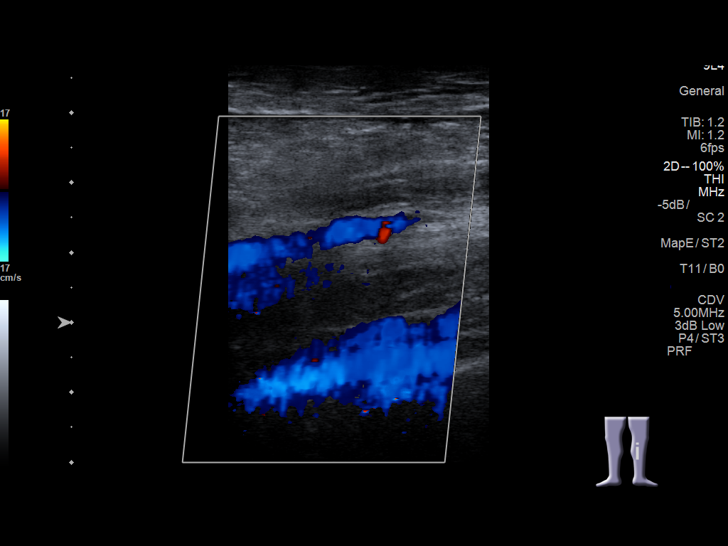

[13 of 24 positions shown; findings below may reference images not displayed]

FINDINGS: RIGHT LOWER EXTREMITY

Common Femoral Vein: No evidence of thrombus. Normal
compressibility, respiratory phasicity and response to augmentation.

Saphenofemoral Junction: No evidence of thrombus. Normal
compressibility and flow on color Doppler imaging.

Profunda Femoral Vein: No evidence of thrombus. Normal
compressibility and flow on color Doppler imaging.

Femoral Vein: No evidence of thrombus. Normal compressibility,
respiratory phasicity and response to augmentation.

Popliteal Vein: No evidence of thrombus. Normal compressibility,
respiratory phasicity and response to augmentation.

Calf Veins: No evidence of thrombus. Normal compressibility and flow
on color Doppler imaging.

Superficial Great Saphenous Vein: No evidence of thrombus. Normal
compressibility and flow on color Doppler imaging.

Venous Reflux:  None.

Other Findings:  None.

LEFT LOWER EXTREMITY

Common Femoral Vein: No evidence of thrombus. Normal
compressibility, respiratory phasicity and response to augmentation.

Saphenofemoral Junction: No evidence of thrombus. Normal
compressibility and flow on color Doppler imaging.

Profunda Femoral Vein: No evidence of thrombus. Normal
compressibility and flow on color Doppler imaging.

Femoral Vein: No evidence of thrombus. Normal compressibility,
respiratory phasicity and response to augmentation.

Popliteal Vein: No evidence of thrombus. Normal compressibility,
respiratory phasicity and response to augmentation.

Calf Veins: No evidence of thrombus. Normal compressibility and flow
on color Doppler imaging.

Superficial Great Saphenous Vein: No evidence of thrombus. Normal
compressibility and flow on color Doppler imaging.

Venous Reflux:  None.

Other Findings:  None.
IMPRESSION: No evidence of deep venous thrombosis.

## 2016-11-13 ENCOUNTER — Other Ambulatory Visit: Payer: Self-pay | Admitting: Internal Medicine

## 2016-11-13 DIAGNOSIS — M5431 Sciatica, right side: Secondary | ICD-10-CM

## 2016-11-13 DIAGNOSIS — M48061 Spinal stenosis, lumbar region without neurogenic claudication: Secondary | ICD-10-CM

## 2016-11-14 ENCOUNTER — Ambulatory Visit
Admission: RE | Admit: 2016-11-14 | Discharge: 2016-11-14 | Disposition: A | Discharge status: Home / Self Care | Payer: PRIVATE HEALTH INSURANCE | Source: Ambulatory Visit | Attending: Internal Medicine | Admitting: Internal Medicine

## 2016-11-14 DIAGNOSIS — M48061 Spinal stenosis, lumbar region without neurogenic claudication: Secondary | ICD-10-CM | POA: Diagnosis present

## 2016-11-14 DIAGNOSIS — M5136 Other intervertebral disc degeneration, lumbar region: Secondary | ICD-10-CM | POA: Diagnosis not present

## 2016-11-14 DIAGNOSIS — M5431 Sciatica, right side: Secondary | ICD-10-CM

## 2017-05-13 ENCOUNTER — Encounter
Admission: RE | Admit: 2017-05-13 | Discharge: 2017-05-13 | Disposition: A | Discharge status: Home / Self Care | Payer: PRIVATE HEALTH INSURANCE | Source: Ambulatory Visit | Attending: Orthopedic Surgery | Admitting: Orthopedic Surgery

## 2017-05-13 DIAGNOSIS — I444 Left anterior fascicular block: Secondary | ICD-10-CM | POA: Insufficient documentation

## 2017-05-13 DIAGNOSIS — M1611 Unilateral primary osteoarthritis, right hip: Secondary | ICD-10-CM | POA: Insufficient documentation

## 2017-05-13 DIAGNOSIS — Z01818 Encounter for other preprocedural examination: Secondary | ICD-10-CM | POA: Insufficient documentation

## 2017-05-13 DIAGNOSIS — I517 Cardiomegaly: Secondary | ICD-10-CM | POA: Diagnosis not present

## 2017-05-13 DIAGNOSIS — R7303 Prediabetes: Secondary | ICD-10-CM | POA: Diagnosis present

## 2017-05-13 DIAGNOSIS — R001 Bradycardia, unspecified: Secondary | ICD-10-CM | POA: Diagnosis not present

## 2017-05-13 HISTORY — DX: Cramp and spasm: R25.2

## 2017-05-13 LAB — CBC
HEMATOCRIT: 40.5 % (ref 40.0–52.0)
HEMOGLOBIN: 13.9 g/dL (ref 13.0–18.0)
MCH: 29.9 pg (ref 26.0–34.0)
MCHC: 34.3 g/dL (ref 32.0–36.0)
MCV: 87.2 fL (ref 80.0–100.0)
Platelets: 209 10*3/uL (ref 150–440)
RBC: 4.65 MIL/uL (ref 4.40–5.90)
RDW: 13.1 % (ref 11.5–14.5)
WBC: 6.1 10*3/uL (ref 3.8–10.6)

## 2017-05-13 LAB — BASIC METABOLIC PANEL
ANION GAP: 5 (ref 5–15)
BUN: 13 mg/dL (ref 6–20)
CHLORIDE: 106 mmol/L (ref 101–111)
CO2: 29 mmol/L (ref 22–32)
CREATININE: 0.56 mg/dL — AB (ref 0.61–1.24)
Calcium: 8.8 mg/dL — ABNORMAL LOW (ref 8.9–10.3)
GFR calc non Af Amer: >60 mL/min (ref >60)
Glucose, Bld: 137 mg/dL — ABNORMAL HIGH (ref 65–99)
Potassium: 3.7 mmol/L (ref 3.5–5.1)
Sodium: 140 mmol/L (ref 135–145)

## 2017-05-13 LAB — URINALYSIS, COMPLETE (UACMP) WITH MICROSCOPIC
Bacteria, UA: NONE SEEN
Bilirubin Urine: NEGATIVE
GLUCOSE, UA: NEGATIVE mg/dL
Hgb urine dipstick: NEGATIVE
Ketones, ur: NEGATIVE mg/dL
Leukocytes, UA: NEGATIVE
Nitrite: NEGATIVE
PH: 7 (ref 5.0–8.0)
Protein, ur: NEGATIVE mg/dL
Specific Gravity, Urine: 1.013 (ref 1.005–1.030)
WBC, UA: NONE SEEN WBC/hpf (ref 0–5)

## 2017-05-13 LAB — PROTIME-INR
INR: 0.99
Prothrombin Time: 13 seconds (ref 11.4–15.2)

## 2017-05-13 LAB — APTT: aPTT: 34 seconds (ref 24–36)

## 2017-05-13 LAB — TYPE AND SCREEN
ABO/RH(D): O POS
ANTIBODY SCREEN: NEGATIVE

## 2017-05-13 LAB — SURGICAL PCR SCREEN
MRSA, PCR: NEGATIVE
STAPHYLOCOCCUS AUREUS: NEGATIVE

## 2017-05-13 LAB — SEDIMENTATION RATE: SED RATE: 3 mm/h (ref 0–20)

## 2017-05-13 NOTE — Patient Instructions (Addendum)
Your procedure is scheduled on: May 26, 2017 [facility] ) Report to Same Day Surgery 2nd floor medical mall (Medical Mall Entrance-take elevator on left to 2nd floor.  Check in with surgery information desk.) To find out your arrival time please call [phone] between 1PM - 3PM on May 25, 2017 (MONDAY ) Remember: Instructions that are not followed completely may result in serious medical risk, up to and including death, or upon the discretion of your surgeon and anesthesiologist your surgery may need to be rescheduled.    _x___ 1. Do not eat food after midnight the night before your procedure. You may drink clear liquids up to 2 hours before you are scheduled to arrive at the hospital for your procedure.  Do not drink clear liquids within 2 hours of your scheduled arrival to the hospital.  Clear liquids include  --Water or Apple juice without pulp  --Clear carbohydrate beverage such as ClearFast or Gatorade  --Black Coffee or Clear Tea (No milk, no creamers, do not add anything to                  the coffee or Tea  No gum chewing or hard candies.     __x__ 2. No Alcohol for 24 hours before or after surgery.   __x__3. No Smoking for 24 prior to surgery.   ____  4. Bring all medications with you on the day of surgery if instructed.    __x__ 5. Notify your doctor if there is any change in your medical condition     (cold, fever, infections).     Do not wear jewelry, make-up, hairpins, clips or nail polish.  Do not wear lotions, powders, or perfumes.   Do not shave 48 hours prior to surgery. Men may shave face and neck.  Do not bring valuables to the hospital.    [facility] is not responsible for any belongings or valuables.               Contacts, dentures or bridgework may not be worn into surgery.  Leave your suitcase in the car. After surgery it may be brought to your room.  For patients admitted to the hospital, discharge time is determined by your treatment team                      Patients discharged the day of surgery will not be allowed to drive home.  You will need someone to drive you home and stay with you the night of your procedure.    Please read over the following fact sheets that you were given:   [facility] Preparing for Surgery and or MRSA Information   ___ Take anti-hypertensive (unless it includes a diuretic), cardiac, seizure, asthma,     anti-reflux and psychiatric medicines. These include:  1.   2.  3.  4.  5.  6.  ____Fleets enema or Magnesium Citrate as directed.   _x___ Use CHG Soap or sage wipes as directed on instruction sheet   ____ Use inhalers on the day of surgery and bring to hospital day of surgery  ____ Stop Metformin and Janumet 2 days prior to surgery.    ____ Take 1/2 of usual insulin dose the night before surgery and none on the morning surgery.     _x___ Follow recommendations from Cardiologist, Pulmonologist or PCP regarding          stopping Aspirin, Coumadin, Plavix ,Eliquis, Effient, or Pradaxa,  and Pletal.  X____Stop Anti-inflammatories such as Advil, Aleve, Ibuprofen, Motrin, Naproxen, Naprosyn, Goodies powders or aspirin products. OK to take Tylenol (STOP MELOXICAM AND ALEVE ONE WEEK BEFORE SURGERY )   _x___ Stop supplements until after surgery.  But may continue Vitamin D, Vitamin B, and multivitamin. (STOP VITAMIN C NOW)     ____ Bring C-Pap to the hospital.

## 2017-05-13 NOTE — Pre-Procedure Instructions (Signed)
MEDICAL REQUEST/EKG,AS INSTRUCTED BY DR Henrene Hawking, CALLED AND FAXED TO DR ANDERSON'S. SPOKE WITH  ALSO FAXED TO DR [facility] OFFICE

## 2017-05-14 LAB — URINE CULTURE: Culture: NO GROWTH

## 2017-05-19 NOTE — Pre-Procedure Instructions (Addendum)
AS INSTRUCTED BY DR P CARROLL, PREOP EKG/ REQUEST FAXED BACK TO DR ANDERSON TO ADDRESS EKG Dareen PianoAS OK TO PROCEED WITH SURGERY OR IF FURTHER WORKUP NEEDED TO HAVE HIS OFFICE SET UP. FAX CONFIRMATION RECEIVED AND LM ON PATTI'S VOICEMAIL TO PLEASE ADDRESS WITH DR Dareen PianoANDERSON NOTED PATIENT HAS APPT WITH DR Dareen PianoANDERSON TODAY 05/19/17

## 2017-05-20 NOTE — Pre-Procedure Instructions (Addendum)
Cleared by dr Dareen Pianoanderson 05/19/17.EKG addressed and ok to proceed

## 2017-05-25 MED ORDER — TRANEXAMIC ACID 1000 MG/10ML IV SOLN
1000.0000 mg | INTRAVENOUS | Status: AC
  Administered 2017-05-26: 1000 mg via INTRAVENOUS
  Filled 2017-05-25: qty 10

## 2017-05-25 MED ORDER — CEFAZOLIN SODIUM-DEXTROSE 2-4 GM/100ML-% IV SOLN
2.0000 g | Freq: Once | INTRAVENOUS | Status: AC
  Administered 2017-05-26 (×2): 2 g via INTRAVENOUS

## 2017-05-26 ENCOUNTER — Encounter
Admission: RE | Disposition: A | Discharge status: Home Health | Payer: Self-pay | Source: Ambulatory Visit | Attending: Orthopedic Surgery

## 2017-05-26 ENCOUNTER — Inpatient Hospital Stay
Admission: RE | Admit: 2017-05-26 | Discharge: 2017-05-30 | DRG: 469 | Disposition: A | Discharge status: Home Health | Payer: PRIVATE HEALTH INSURANCE | Source: Ambulatory Visit | Attending: Internal Medicine | Admitting: Internal Medicine

## 2017-05-26 ENCOUNTER — Inpatient Hospital Stay: Payer: PRIVATE HEALTH INSURANCE | Admitting: Anesthesiology

## 2017-05-26 ENCOUNTER — Inpatient Hospital Stay: Payer: PRIVATE HEALTH INSURANCE

## 2017-05-26 ENCOUNTER — Encounter: Payer: Self-pay | Admitting: *Deleted

## 2017-05-26 DIAGNOSIS — D72829 Elevated white blood cell count, unspecified: Secondary | ICD-10-CM | POA: Diagnosis present

## 2017-05-26 DIAGNOSIS — J9601 Acute respiratory failure with hypoxia: Secondary | ICD-10-CM | POA: Diagnosis not present

## 2017-05-26 DIAGNOSIS — E7800 Pure hypercholesterolemia, unspecified: Secondary | ICD-10-CM | POA: Diagnosis present

## 2017-05-26 DIAGNOSIS — I471 Supraventricular tachycardia: Secondary | ICD-10-CM | POA: Diagnosis present

## 2017-05-26 DIAGNOSIS — Z87891 Personal history of nicotine dependence: Secondary | ICD-10-CM | POA: Diagnosis not present

## 2017-05-26 DIAGNOSIS — Z96652 Presence of left artificial knee joint: Secondary | ICD-10-CM | POA: Diagnosis present

## 2017-05-26 DIAGNOSIS — R0602 Shortness of breath: Secondary | ICD-10-CM | POA: Diagnosis not present

## 2017-05-26 DIAGNOSIS — E785 Hyperlipidemia, unspecified: Secondary | ICD-10-CM | POA: Diagnosis present

## 2017-05-26 DIAGNOSIS — Z8249 Family history of ischemic heart disease and other diseases of the circulatory system: Secondary | ICD-10-CM | POA: Diagnosis not present

## 2017-05-26 DIAGNOSIS — Z96642 Presence of left artificial hip joint: Secondary | ICD-10-CM | POA: Diagnosis present

## 2017-05-26 DIAGNOSIS — E119 Type 2 diabetes mellitus without complications: Secondary | ICD-10-CM | POA: Diagnosis present

## 2017-05-26 DIAGNOSIS — I5031 Acute diastolic (congestive) heart failure: Secondary | ICD-10-CM | POA: Diagnosis not present

## 2017-05-26 DIAGNOSIS — G8918 Other acute postprocedural pain: Secondary | ICD-10-CM

## 2017-05-26 DIAGNOSIS — M1611 Unilateral primary osteoarthritis, right hip: Principal | ICD-10-CM | POA: Diagnosis present

## 2017-05-26 DIAGNOSIS — I110 Hypertensive heart disease with heart failure: Secondary | ICD-10-CM | POA: Diagnosis present

## 2017-05-26 DIAGNOSIS — J9811 Atelectasis: Secondary | ICD-10-CM | POA: Diagnosis not present

## 2017-05-26 DIAGNOSIS — Z9660 Presence of unspecified orthopedic joint implant: Secondary | ICD-10-CM

## 2017-05-26 DIAGNOSIS — I444 Left anterior fascicular block: Secondary | ICD-10-CM | POA: Diagnosis present

## 2017-05-26 DIAGNOSIS — R0902 Hypoxemia: Secondary | ICD-10-CM | POA: Diagnosis not present

## 2017-05-26 DIAGNOSIS — I248 Other forms of acute ischemic heart disease: Secondary | ICD-10-CM | POA: Diagnosis present

## 2017-05-26 DIAGNOSIS — E118 Type 2 diabetes mellitus with unspecified complications: Secondary | ICD-10-CM | POA: Diagnosis not present

## 2017-05-26 DIAGNOSIS — Z96641 Presence of right artificial hip joint: Secondary | ICD-10-CM | POA: Diagnosis not present

## 2017-05-26 HISTORY — PX: TOTAL HIP ARTHROPLASTY: SHX124

## 2017-05-26 LAB — CBC
HCT: 35.7 % — ABNORMAL LOW (ref 40.0–52.0)
HEMOGLOBIN: 12.2 g/dL — AB (ref 13.0–18.0)
MCH: 29.2 pg (ref 26.0–34.0)
MCHC: 34.2 g/dL (ref 32.0–36.0)
MCV: 85.6 fL (ref 80.0–100.0)
Platelets: 178 10*3/uL (ref 150–440)
RBC: 4.17 MIL/uL — ABNORMAL LOW (ref 4.40–5.90)
RDW: 13.3 % (ref 11.5–14.5)
WBC: 15.5 10*3/uL — ABNORMAL HIGH (ref 3.8–10.6)

## 2017-05-26 LAB — CREATININE, SERUM
CREATININE: 0.61 mg/dL (ref 0.61–1.24)
GFR calc Af Amer: >60 mL/min (ref >60)
GFR calc non Af Amer: >60 mL/min (ref >60)

## 2017-05-26 LAB — GLUCOSE, CAPILLARY
GLUCOSE-CAPILLARY: 107 mg/dL — AB (ref 65–99)
Glucose-Capillary: 139 mg/dL — ABNORMAL HIGH (ref 65–99)

## 2017-05-26 SURGERY — ARTHROPLASTY, HIP, TOTAL, ANTERIOR APPROACH
Anesthesia: Spinal | Laterality: Right | Wound class: Clean

## 2017-05-26 MED ORDER — MAGNESIUM HYDROXIDE 400 MG/5ML PO SUSP
30.0000 mL | Freq: Every day | ORAL | Status: DC | PRN
  Administered 2017-05-28 – 2017-05-30 (×3): 30 mL via ORAL
  Filled 2017-05-26 (×3): qty 30

## 2017-05-26 MED ORDER — METOCLOPRAMIDE HCL 10 MG PO TABS: 5.0000 mg | ORAL_TABLET | Freq: Three times a day (TID) | ORAL | Status: DC | PRN

## 2017-05-26 MED ORDER — BISACODYL 10 MG RE SUPP
10.0000 mg | Freq: Every day | RECTAL | Status: DC | PRN
  Administered 2017-05-30: 10 mg via RECTAL
  Filled 2017-05-26: qty 1

## 2017-05-26 MED ORDER — ONDANSETRON HCL 4 MG/2ML IJ SOLN
4.0000 mg | Freq: Four times a day (QID) | INTRAMUSCULAR | Status: DC | PRN
  Administered 2017-05-26: 4 mg via INTRAVENOUS
  Filled 2017-05-26: qty 2

## 2017-05-26 MED ORDER — SODIUM CHLORIDE 0.9 % IV SOLN
INTRAVENOUS | Status: DC
  Administered 2017-05-26 (×2): via INTRAVENOUS

## 2017-05-26 MED ORDER — PHENYLEPHRINE HCL 10 MG/ML IJ SOLN
INTRAMUSCULAR | Status: AC
  Filled 2017-05-26: qty 1

## 2017-05-26 MED ORDER — NEOMYCIN-POLYMYXIN B GU 40-200000 IR SOLN
Status: DC | PRN
  Administered 2017-05-26: 4 mL

## 2017-05-26 MED ORDER — ENOXAPARIN SODIUM 40 MG/0.4ML ~~LOC~~ SOLN
40.0000 mg | SUBCUTANEOUS | Status: DC
  Administered 2017-05-27 – 2017-05-30 (×4): 40 mg via SUBCUTANEOUS
  Filled 2017-05-26 (×4): qty 0.4

## 2017-05-26 MED ORDER — PROPOFOL 10 MG/ML IV BOLUS
INTRAVENOUS | Status: DC | PRN
Start: 2017-05-26 — End: 2017-05-26
  Administered 2017-05-26: 30 mg via INTRAVENOUS

## 2017-05-26 MED ORDER — PROMETHAZINE HCL 25 MG/ML IJ SOLN: 6.2500 mg | INTRAMUSCULAR | Status: DC | PRN

## 2017-05-26 MED ORDER — METHOCARBAMOL 1000 MG/10ML IJ SOLN
500.0000 mg | Freq: Four times a day (QID) | INTRAVENOUS | Status: DC | PRN
  Filled 2017-05-26: qty 5

## 2017-05-26 MED ORDER — METOCLOPRAMIDE HCL 5 MG/ML IJ SOLN: 5.0000 mg | Freq: Three times a day (TID) | INTRAMUSCULAR | Status: DC | PRN

## 2017-05-26 MED ORDER — HYDROCODONE-ACETAMINOPHEN 7.5-325 MG PO TABS
1.0000 | ORAL_TABLET | ORAL | Status: DC | PRN
  Administered 2017-05-26: 1 via ORAL
  Administered 2017-05-27 – 2017-05-30 (×8): 2 via ORAL
  Filled 2017-05-26 (×4): qty 2
  Filled 2017-05-26: qty 1
  Filled 2017-05-26 (×4): qty 2

## 2017-05-26 MED ORDER — SODIUM CHLORIDE 0.9 % IV SOLN
INTRAVENOUS | Status: DC | PRN
  Administered 2017-05-26: 50 ug/min via INTRAVENOUS

## 2017-05-26 MED ORDER — CEFAZOLIN SODIUM-DEXTROSE 2-4 GM/100ML-% IV SOLN
2.0000 g | Freq: Four times a day (QID) | INTRAVENOUS | Status: AC
  Administered 2017-05-27 (×3): 2 g via INTRAVENOUS
  Filled 2017-05-26 (×3): qty 100

## 2017-05-26 MED ORDER — MIDAZOLAM HCL 5 MG/5ML IJ SOLN
INTRAMUSCULAR | Status: DC | PRN
  Administered 2017-05-26: 2 mg via INTRAVENOUS

## 2017-05-26 MED ORDER — BUPIVACAINE-EPINEPHRINE 0.25% -1:200000 IJ SOLN
INTRAMUSCULAR | Status: DC | PRN
  Administered 2017-05-26: 30 mL

## 2017-05-26 MED ORDER — ACETAMINOPHEN 325 MG PO TABS: 650.0000 mg | ORAL_TABLET | Freq: Four times a day (QID) | ORAL | Status: DC | PRN

## 2017-05-26 MED ORDER — SODIUM CHLORIDE 0.9 % IV SOLN
INTRAVENOUS | Status: DC
  Administered 2017-05-26: 21:00:00 via INTRAVENOUS

## 2017-05-26 MED ORDER — PHENOL 1.4 % MT LIQD
1.0000 | OROMUCOSAL | Status: DC | PRN
  Filled 2017-05-26: qty 177

## 2017-05-26 MED ORDER — PHENYLEPHRINE HCL 10 MG/ML IJ SOLN
INTRAMUSCULAR | Status: DC | PRN
  Administered 2017-05-26: 50 ug via INTRAVENOUS
  Administered 2017-05-26 (×2): 100 ug via INTRAVENOUS
  Administered 2017-05-26: 50 ug via INTRAVENOUS

## 2017-05-26 MED ORDER — ACETAMINOPHEN 650 MG RE SUPP: 650.0000 mg | Freq: Four times a day (QID) | RECTAL | Status: DC | PRN

## 2017-05-26 MED ORDER — FENTANYL CITRATE (PF) 100 MCG/2ML IJ SOLN
INTRAMUSCULAR | Status: AC
  Filled 2017-05-26: qty 2

## 2017-05-26 MED ORDER — PROPOFOL 10 MG/ML IV BOLUS
INTRAVENOUS | Status: AC
  Filled 2017-05-26: qty 20

## 2017-05-26 MED ORDER — ACETAMINOPHEN 10 MG/ML IV SOLN
INTRAVENOUS | Status: AC
  Filled 2017-05-26: qty 100

## 2017-05-26 MED ORDER — ALUM & MAG HYDROXIDE-SIMETH 200-200-20 MG/5ML PO SUSP: 30.0000 mL | ORAL | Status: DC | PRN

## 2017-05-26 MED ORDER — DIPHENHYDRAMINE HCL 12.5 MG/5ML PO ELIX: 12.5000 mg | ORAL_SOLUTION | ORAL | Status: DC | PRN

## 2017-05-26 MED ORDER — PROPOFOL 500 MG/50ML IV EMUL
INTRAVENOUS | Status: AC
  Filled 2017-05-26: qty 50

## 2017-05-26 MED ORDER — MENTHOL 3 MG MT LOZG
1.0000 | LOZENGE | OROMUCOSAL | Status: DC | PRN
  Filled 2017-05-26: qty 9

## 2017-05-26 MED ORDER — NEOMYCIN-POLYMYXIN B GU 40-200000 IR SOLN
Status: AC
  Filled 2017-05-26: qty 4

## 2017-05-26 MED ORDER — POLYVINYL ALCOHOL 1.4 % OP SOLN
1.0000 [drp] | OPHTHALMIC | Status: DC | PRN
  Filled 2017-05-26: qty 15

## 2017-05-26 MED ORDER — CEFAZOLIN SODIUM-DEXTROSE 2-4 GM/100ML-% IV SOLN
INTRAVENOUS | Status: AC
  Filled 2017-05-26: qty 100

## 2017-05-26 MED ORDER — LIDOCAINE HCL (CARDIAC) 20 MG/ML IV SOLN
INTRAVENOUS | Status: DC | PRN
  Administered 2017-05-26: 60 mg via INTRAVENOUS

## 2017-05-26 MED ORDER — METHOCARBAMOL 500 MG PO TABS
500.0000 mg | ORAL_TABLET | Freq: Four times a day (QID) | ORAL | Status: DC | PRN
  Administered 2017-05-27 – 2017-05-28 (×3): 500 mg via ORAL
  Filled 2017-05-26 (×3): qty 1

## 2017-05-26 MED ORDER — FAMOTIDINE 20 MG PO TABS
ORAL_TABLET | ORAL | Status: AC
  Administered 2017-05-26: 20 mg
  Filled 2017-05-26: qty 1

## 2017-05-26 MED ORDER — MORPHINE SULFATE (PF) 2 MG/ML IV SOLN
2.0000 mg | INTRAVENOUS | Status: DC | PRN
  Administered 2017-05-27 (×2): 2 mg via INTRAVENOUS
  Filled 2017-05-26 (×2): qty 1

## 2017-05-26 MED ORDER — PRAVASTATIN SODIUM 20 MG PO TABS
20.0000 mg | ORAL_TABLET | Freq: Every evening | ORAL | Status: DC
  Administered 2017-05-27 – 2017-05-29 (×3): 20 mg via ORAL
  Filled 2017-05-26 (×3): qty 1

## 2017-05-26 MED ORDER — DOCUSATE SODIUM 100 MG PO CAPS
100.0000 mg | ORAL_CAPSULE | Freq: Two times a day (BID) | ORAL | Status: DC
  Administered 2017-05-26 – 2017-05-30 (×8): 100 mg via ORAL
  Filled 2017-05-26 (×8): qty 1

## 2017-05-26 MED ORDER — VITAMIN C 500 MG PO TABS
500.0000 mg | ORAL_TABLET | Freq: Every day | ORAL | Status: DC
  Administered 2017-05-27 – 2017-05-30 (×3): 500 mg via ORAL
  Filled 2017-05-26 (×5): qty 1

## 2017-05-26 MED ORDER — PROPOFOL 500 MG/50ML IV EMUL
INTRAVENOUS | Status: DC | PRN
  Administered 2017-05-26: 50 ug/kg/min via INTRAVENOUS
  Administered 2017-05-26: 100 ug/kg/min via INTRAVENOUS

## 2017-05-26 MED ORDER — MIDAZOLAM HCL 2 MG/2ML IJ SOLN
INTRAMUSCULAR | Status: AC
  Filled 2017-05-26: qty 2

## 2017-05-26 MED ORDER — FAMOTIDINE 20 MG PO TABS: 20.0000 mg | ORAL_TABLET | Freq: Once | ORAL | Status: DC

## 2017-05-26 MED ORDER — ONDANSETRON HCL 4 MG PO TABS
4.0000 mg | ORAL_TABLET | Freq: Four times a day (QID) | ORAL | Status: DC | PRN
  Administered 2017-05-27: 4 mg via ORAL
  Filled 2017-05-26: qty 1

## 2017-05-26 MED ORDER — BUPIVACAINE HCL (PF) 0.5 % IJ SOLN
INTRAMUSCULAR | Status: DC | PRN
  Administered 2017-05-26: 3 mL

## 2017-05-26 MED ORDER — ZOLPIDEM TARTRATE 5 MG PO TABS
5.0000 mg | ORAL_TABLET | Freq: Every evening | ORAL | Status: DC | PRN
Start: 2017-05-26 — End: 2017-05-30

## 2017-05-26 MED ORDER — MAGNESIUM OXIDE 400 (241.3 MG) MG PO TABS
400.0000 mg | ORAL_TABLET | Freq: Every day | ORAL | Status: DC | PRN
Start: 2017-05-26 — End: 2017-05-30

## 2017-05-26 MED ORDER — MAGNESIUM CITRATE PO SOLN
1.0000 | Freq: Once | ORAL | Status: DC | PRN
  Filled 2017-05-26 (×3): qty 296

## 2017-05-26 MED ORDER — BUPIVACAINE-EPINEPHRINE (PF) 0.25% -1:200000 IJ SOLN
INTRAMUSCULAR | Status: AC
  Filled 2017-05-26: qty 30

## 2017-05-26 MED ORDER — LIDOCAINE HCL (PF) 2 % IJ SOLN
INTRAMUSCULAR | Status: AC
  Filled 2017-05-26: qty 2

## 2017-05-26 MED ORDER — FENTANYL CITRATE (PF) 100 MCG/2ML IJ SOLN: 25.0000 ug | INTRAMUSCULAR | Status: DC | PRN

## 2017-05-26 SURGICAL SUPPLY — 53 items
BLADE SAW SAG 18.5X105 (BLADE) ×3 IMPLANT
BNDG COHESIVE 6X5 TAN STRL LF (GAUZE/BANDAGES/DRESSINGS) ×9 IMPLANT
CANISTER SUCT 1200ML W/VALVE (MISCELLANEOUS) ×3 IMPLANT
CAPT HIP TOTAL HALF DEPUY (Capitated) ×3 IMPLANT
CAPT HIP TOTAL HALF MEDACTA (Capitated) ×3 IMPLANT
CATH FOL LEG HOLDER (MISCELLANEOUS) ×3 IMPLANT
CATH TRAY METER 16FR LF (MISCELLANEOUS) ×3 IMPLANT
CHLORAPREP W/TINT 26ML (MISCELLANEOUS) ×3 IMPLANT
DRAPE C-ARM XRAY 36X54 (DRAPES) ×3 IMPLANT
DRAPE INCISE IOBAN 66X60 STRL (DRAPES) IMPLANT
DRAPE POUCH INSTRU U-SHP 10X18 (DRAPES) ×3 IMPLANT
DRAPE SHEET LG 3/4 BI-LAMINATE (DRAPES) ×9 IMPLANT
DRAPE TABLE BACK 80X90 (DRAPES) ×3 IMPLANT
DRESSING SURGICEL FIBRLLR 1X2 (HEMOSTASIS) ×2 IMPLANT
DRSG OPSITE POSTOP 4X8 (GAUZE/BANDAGES/DRESSINGS) ×6 IMPLANT
DRSG SURGICEL FIBRILLAR 1X2 (HEMOSTASIS) ×6
ELECT BLADE 6.5 EXT (BLADE) ×3 IMPLANT
ELECT REM PT RETURN 9FT ADLT (ELECTROSURGICAL) ×3
ELECTRODE REM PT RTRN 9FT ADLT (ELECTROSURGICAL) ×1 IMPLANT
EVACUATOR 1/8 PVC DRAIN (DRAIN) IMPLANT
GLOVE BIOGEL PI IND STRL 6.5 (GLOVE) ×2 IMPLANT
GLOVE BIOGEL PI IND STRL 9 (GLOVE) ×1 IMPLANT
GLOVE BIOGEL PI INDICATOR 6.5 (GLOVE) ×4
GLOVE BIOGEL PI INDICATOR 9 (GLOVE) ×2
GLOVE SURG SYN 9.0  PF PI (GLOVE) ×4
GLOVE SURG SYN 9.0 PF PI (GLOVE) ×2 IMPLANT
GOWN SRG 2XL LVL 4 RGLN SLV (GOWNS) ×1 IMPLANT
GOWN STRL NON-REIN 2XL LVL4 (GOWNS) ×2
GOWN STRL REUS W/ TWL LRG LVL3 (GOWN DISPOSABLE) ×1 IMPLANT
GOWN STRL REUS W/TWL LRG LVL3 (GOWN DISPOSABLE) ×2
HOOD PEEL AWAY FLYTE STAYCOOL (MISCELLANEOUS) ×3 IMPLANT
KIT PREVENA INCISION MGT 13 (CANNISTER) ×3 IMPLANT
MAT BLUE FLOOR 46X72 FLO (MISCELLANEOUS) ×3 IMPLANT
NDL SAFETY 18GX1.5 (NEEDLE) ×3 IMPLANT
NEEDLE SPNL 18GX3.5 QUINCKE PK (NEEDLE) ×3 IMPLANT
NS IRRIG 1000ML POUR BTL (IV SOLUTION) ×3 IMPLANT
PACK HIP COMPR (MISCELLANEOUS) ×3 IMPLANT
SOL PREP PVP 2OZ (MISCELLANEOUS) ×3
SOLUTION PREP PVP 2OZ (MISCELLANEOUS) ×1 IMPLANT
SPONGE DRAIN TRACH 4X4 STRL 2S (GAUZE/BANDAGES/DRESSINGS) ×3 IMPLANT
STAPLER SKIN PROX 35W (STAPLE) ×3 IMPLANT
STRAP SAFETY BODY (MISCELLANEOUS) ×3 IMPLANT
SUT DVC 2 QUILL PDO  T11 36X36 (SUTURE) ×2
SUT DVC 2 QUILL PDO T11 36X36 (SUTURE) ×1 IMPLANT
SUT SILK 0 (SUTURE) ×2
SUT SILK 0 30XBRD TIE 6 (SUTURE) ×1 IMPLANT
SUT V-LOC 90 ABS DVC 3-0 CL (SUTURE) ×3 IMPLANT
SUT VIC AB 1 CT1 36 (SUTURE) ×3 IMPLANT
SYR 20CC LL (SYRINGE) ×3 IMPLANT
SYR 30ML LL (SYRINGE) ×3 IMPLANT
TAPE MICROFOAM 4IN (TAPE) ×3 IMPLANT
TOWEL OR 17X26 4PK STRL BLUE (TOWEL DISPOSABLE) ×3 IMPLANT
WND VAC CANISTER 500ML (MISCELLANEOUS) ×3 IMPLANT

## 2017-05-26 NOTE — Transfer of Care (Signed)
Immediate Anesthesia Transfer of Care Note  Patient: James Finley  Procedure(s) Performed: Procedure(s): TOTAL HIP ARTHROPLASTY ANTERIOR APPROACH (Right)  Patient Location: PACU  Anesthesia Type:General  Level of Consciousness: sedated  Airway & Oxygen Therapy: Patient Spontanous Breathing and Patient connected to face mask oxygen  Post-op Assessment: Report given to RN and Post -op Vital signs reviewed and stable  Post vital signs: Reviewed and stable  Last Vitals:  Vitals:   05/26/17 1243  BP: (!) 151/105  Pulse: 78  Resp: 16  Temp: 36.6 C  SpO2: 99%    Last Pain:  Vitals:   05/26/17 1243  TempSrc: Oral  PainSc: 8       Patients Stated Pain Goal: 2 (05/26/17 1243)  Complications: No apparent anesthesia complications

## 2017-05-26 NOTE — Anesthesia Post-op Follow-up Note (Signed)
Anesthesia QCDR form completed.        

## 2017-05-26 NOTE — Anesthesia Preprocedure Evaluation (Signed)
Anesthesia Evaluation  Patient identified by MRN, date of birth, ID band Patient awake    Reviewed: Allergy & Precautions, H&P , NPO status , Patient's Chart, lab work & pertinent test results, reviewed documented beta blocker date and time   History of Anesthesia Complications Negative for: history of anesthetic complications  Airway Mallampati: III  TM Distance: >3 FB Neck ROM: full    Dental  (+) Partial Upper, Missing, Poor Dentition   Pulmonary neg pulmonary ROS, former smoker,    Pulmonary exam normal breath sounds clear to auscultation       Cardiovascular Exercise Tolerance: Good negative cardio ROS Normal cardiovascular exam Rhythm:regular Rate:Normal     Neuro/Psych negative neurological ROS  negative psych ROS   GI/Hepatic negative GI ROS, Neg liver ROS,   Endo/Other  diabetes  Renal/GU negative Renal ROS  negative genitourinary   Musculoskeletal   Abdominal   Peds  Hematology negative hematology ROS (+)   Anesthesia Other Findings Past Medical History:   Hypercholesteremia                              unk          Arthritis                                                    Diabetes mellitus without complication (HCC)                   Comment:diet controlled   Reproductive/Obstetrics negative OB ROS                             Anesthesia Physical  Anesthesia Plan  ASA: II  Anesthesia Plan: Spinal   Post-op Pain Management:    Induction:   PONV Risk Score and Plan: 1 and Ondansetron and Dexamethasone  Airway Management Planned: Natural Airway and Nasal Cannula  Additional Equipment:   Intra-op Plan:   Post-operative Plan:   Informed Consent: I have reviewed the patients History and Physical, chart, labs and discussed the procedure including the risks, benefits and alternatives for the proposed anesthesia with the patient or authorized representative who has  indicated his/her understanding and acceptance.   Dental Advisory Given  Plan Discussed with: Anesthesiologist, CRNA and Surgeon  Anesthesia Plan Comments:         Anesthesia Quick Evaluation

## 2017-05-26 NOTE — Op Note (Signed)
05/26/2017  6:04 PM  PATIENT:  James Finley  61 y.o. male  PRE-OPERATIVE DIAGNOSIS:  primary localized osteoarthritis of right hip  POST-OPERATIVE DIAGNOSIS:  osteoarthritis of right hip  PROCEDURE:  Procedure(s): TOTAL HIP ARTHROPLASTY ANTERIOR APPROACH (Right)  SURGEON: Leitha SchullerMichael J Glennie Bose, MD  ASSISTANTS: none  ANESTHESIA:   spinal  EBL:  Total I/O In: 1200 [I.V.:1200] Out: 700 [Urine:200; Blood:500]  BLOOD ADMINISTERED:none  DRAINS: none   LOCAL MEDICATIONS USED:  MARCAINE     SPECIMEN:  Source of Specimen:  right femoral head  DISPOSITION OF SPECIMEN:  PATHOLOGY  COUNTS:  YES  TOURNIQUET:  * No tourniquets in log *  IMPLANTS: Medact Mpact DM 54 mm with liner.  DePuy Actis 8 standard stem with +5 mm ceramic head  DICTATION: .Dragon Dictation   The patient was brought to the operating room and after spinal anesthesia was obtained patient was placed on the operative table with the ipsilateral foot into the Medacta attachment, contralateral leg on a well-padded table. C-arm was brought in and preop template x-ray taken. After prepping and draping in usual sterile fashion appropriate patient identification and timeout procedures were completed. Anterior approach to the hip was obtained and centered over the greater trochanter and TFL muscle. The subcutaneous tissue was incised hemostasis being achieved by electrocautery. TFL fascia was incised and the muscle retracted laterally deep retractor placed. The lateral femoral circumflex vessels were identified and ligated. The anterior capsule was exposed and a capsulotomy performed. The neck was identified and a femoral neck cut carried out with a saw. The head was removed without difficulty and showed sclerotic femoral head and acetabulum. Reaming was carried out to 54 mm and a 54 mm cup trial gave appropriate tightness to the acetabular component a 54 DM cup was impacted into position. The leg was then externally rotated and  ischiofemoral and pubofemoral releases carried out. The femur was sequentially broached to a size 8, size 8standard trials were placed and the final components chosen. The 8 standard stem was inserted along with a +5  ceramic 28 mm head and 54 mm liner. The hip was reduced and was stable the wound was thoroughly irrigated and dibrillar applied posteriorly and medially. The deep fascia was closed using a heavy Quill after infiltration of 30 cc of quarter percent Sensorcaine with epinephrine. 3-0 v-locl to close the skin with skin staples Xeroform and honeycomb dressing applied  PLAN OF CARE: Admit to inpatient

## 2017-05-26 NOTE — Anesthesia Procedure Notes (Signed)
Spinal  Patient location during procedure: OR Start time: 05/26/2017 4:33 PM End time: 05/26/2017 4:42 PM Staffing Anesthesiologist: Gunnar Fusi Resident/CRNA: Dionne Bucy Performed: resident/CRNA  Preanesthetic Checklist Completed: patient identified, site marked, surgical consent, pre-op evaluation, timeout performed, IV checked, risks and benefits discussed and monitors and equipment checked Spinal Block Patient position: sitting Prep: ChloraPrep Patient monitoring: heart rate, continuous pulse ox, blood pressure and cardiac monitor Approach: midline Location: L4-5 Injection technique: single-shot Needle Needle type: Introducer and Pencan  Needle gauge: 24 G Needle length: 10 cm Assessment Sensory level: T10 Additional Notes Negative paresthesia. Negative blood return. Positive free-flowing CSF. Expiration date of kit checked and confirmed. Patient tolerated procedure well, without complications.

## 2017-05-26 NOTE — H&P (Signed)
Reviewed paper H+P, will be scanned into chart. No changes noted.  

## 2017-05-27 ENCOUNTER — Encounter: Payer: Self-pay | Admitting: Orthopedic Surgery

## 2017-05-27 LAB — BASIC METABOLIC PANEL
ANION GAP: 6 (ref 5–15)
BUN: 10 mg/dL (ref 6–20)
CO2: 26 mmol/L (ref 22–32)
Calcium: 8.1 mg/dL — ABNORMAL LOW (ref 8.9–10.3)
Chloride: 104 mmol/L (ref 101–111)
Creatinine, Ser: 0.6 mg/dL — ABNORMAL LOW (ref 0.61–1.24)
GLUCOSE: 191 mg/dL — AB (ref 65–99)
POTASSIUM: 3.7 mmol/L (ref 3.5–5.1)
Sodium: 136 mmol/L (ref 135–145)

## 2017-05-27 LAB — CBC
HCT: 35.6 % — ABNORMAL LOW (ref 40.0–52.0)
Hemoglobin: 12.3 g/dL — ABNORMAL LOW (ref 13.0–18.0)
MCH: 29.5 pg (ref 26.0–34.0)
MCHC: 34.4 g/dL (ref 32.0–36.0)
MCV: 85.7 fL (ref 80.0–100.0)
Platelets: 166 10*3/uL (ref 150–440)
RBC: 4.15 MIL/uL — ABNORMAL LOW (ref 4.40–5.90)
RDW: 13.5 % (ref 11.5–14.5)
WBC: 11.3 10*3/uL — ABNORMAL HIGH (ref 3.8–10.6)

## 2017-05-27 LAB — GLUCOSE, CAPILLARY: Glucose-Capillary: 153 mg/dL — ABNORMAL HIGH (ref 65–99)

## 2017-05-27 MED ORDER — DOCUSATE SODIUM 100 MG PO CAPS: 100.0000 mg | ORAL_CAPSULE | Freq: Two times a day (BID) | ORAL | 0 refills | Status: AC

## 2017-05-27 MED ORDER — METHOCARBAMOL 500 MG PO TABS: 500.0000 mg | ORAL_TABLET | Freq: Four times a day (QID) | ORAL | 0 refills | Status: DC | PRN

## 2017-05-27 MED ORDER — ENOXAPARIN SODIUM 40 MG/0.4ML ~~LOC~~ SOLN: 40.0000 mg | SUBCUTANEOUS | 0 refills | Status: DC

## 2017-05-27 MED ORDER — HYDROCODONE-ACETAMINOPHEN 7.5-325 MG PO TABS: 1.0000 | ORAL_TABLET | Freq: Four times a day (QID) | ORAL | 0 refills | Status: DC | PRN

## 2017-05-27 MED ORDER — SCOPOLAMINE 1 MG/3DAYS TD PT72
1.0000 | MEDICATED_PATCH | TRANSDERMAL | Status: DC
  Administered 2017-05-27: 1.5 mg via TRANSDERMAL
  Filled 2017-05-27 (×2): qty 1

## 2017-05-27 NOTE — Discharge Instructions (Signed)

## 2017-05-27 NOTE — Evaluation (Signed)
Physical Therapy Evaluation Patient Details Name: James Finley MRN: [id number] DOB: [birth year] Today's Date: 05/27/2017   History of Present Illness  James Finley is a 61 yo male 1 Day Post-Op Procedure(s) (LRB): R THA anterior approach.  Clinical Impression  Pt presents to PT with pain, weakness, decreased functional mobility, and difficulty walking and would benefit from acute PT services.   Pt moving well overall despite vomiting while sitting up and able to ambulate 25' with RW and CGA.  Expect pt to progress well towards goals.    Follow Up Recommendations Home health PT    Equipment Recommendations  Rolling walker with 5" wheels    Recommendations for Other Services       Precautions / Restrictions Precautions Precautions: Anterior Hip;Fall Precaution Booklet Issued: Yes (comment) Restrictions Weight Bearing Restrictions: Yes RLE Weight Bearing: Weight bearing as tolerated      Mobility  Bed Mobility Overal bed mobility: Needs Assistance Bed Mobility: Supine to Sit     Supine to sit: Min assist     General bed mobility comments: Assist for R LE management off bed and to lower to floor; HOB elevated and using bed rails, exiting to R side of bed.  Transfers Overall transfer level: Needs assistance Equipment used: Rolling walker (2 wheeled) Transfers: Sit to/from Stand Sit to Stand: Supervision         General transfer comment: Good UE and LE placement prior to transfer, lifts off on first attempt. slow to get into full upright posture.  Ambulation/Gait Ambulation/Gait assistance: Min guard Ambulation Distance (Feet): 70 Feet Assistive device: Rolling walker (2 wheeled) Gait Pattern/deviations: Step-to pattern;Antalgic     General Gait Details: Pt required verbal cues for gait sequence with RW step-to pattern with good carry over during remainder of session.  Gait steady with increased forward trunk lean.  Stairs            Wheelchair  Mobility    Modified Rankin (Stroke Patients Only)       Balance Overall balance assessment: Modified Independent                                           Pertinent Vitals/Pain Pain Assessment: 0-10 Pain Score: 5  Pain Location: R groin Pain Descriptors / Indicators: Aching;Sharp Pain Intervention(s): Limited activity within patient's tolerance;Monitored during session;Premedicated before session    Home Living Family/patient expects to be discharged to:: Private residence Living Arrangements: Spouse/significant other   Type of Home: House Home Access: Stairs to enter Entrance Stairs-Rails: Lawyer of Steps: 6 Home Layout: One level Home Equipment: Environmental consultant - 4 wheels;Cane - single point;Bedside commode;Shower seat      Prior Function Level of Independence: Independent with assistive device(s)         Comments: Prior L THA and TKA; uses cane as needed prior to surgery     Hand Dominance   Dominant Hand: Left    Extremity/Trunk Assessment   Upper Extremity Assessment Upper Extremity Assessment: Overall WFL for tasks assessed    Lower Extremity Assessment Lower Extremity Assessment: RLE deficits/detail RLE Deficits / Details: able to assist with R LE sliding across bed RLE: Unable to fully assess due to pain    Cervical / Trunk Assessment Cervical / Trunk Assessment: Normal  Communication   Communication: No difficulties  Cognition Arousal/Alertness: Awake/alert Behavior During Therapy: WFL for tasks assessed/performed (drowsy) Overall  Cognitive Status: Within Functional Limits for tasks assessed                                        General Comments General comments (skin integrity, edema, etc.): surgical bandage intact    Exercises Total Joint Exercises Ankle Circles/Pumps: AROM;Strengthening;10 reps Quad Sets: AROM;Strengthening;10 reps Gluteal Sets: AROM;Strengthening;10 reps    Assessment/Plan    PT Assessment Patient needs continued PT services  PT Problem List Decreased strength;Decreased range of motion;Decreased activity tolerance;Decreased mobility;Decreased knowledge of use of DME;Pain       PT Treatment Interventions DME instruction;Gait training;Stair training;Functional mobility training;Therapeutic activities;Therapeutic exercise;Patient/family education    PT Goals (Current goals can be found in the Care Plan section)  Acute Rehab PT Goals Patient Stated Goal: To walk. PT Goal Formulation: With patient Time For Goal Achievement: 06/03/17 Potential to Achieve Goals: Good    Frequency BID   Barriers to discharge        Co-evaluation               AM-PAC PT "6 Clicks" Daily Activity  Outcome Measure Difficulty turning over in bed (including adjusting bedclothes, sheets and blankets)?: A Lot Difficulty moving from lying on back to sitting on the side of the bed? : A Lot Difficulty sitting down on and standing up from a chair with arms (e.g., wheelchair, bedside commode, etc,.)?: A Little Help needed moving to and from a bed to chair (including a wheelchair)?: A Little Help needed walking in hospital room?: A Little Help needed climbing 3-5 steps with a railing? : A Lot 6 Click Score: 15    End of Session Equipment Utilized During Treatment: Gait belt Activity Tolerance: Patient tolerated treatment well Patient left: in chair;with call bell/phone within reach;with chair alarm set Nurse Communication: Mobility status PT Visit Diagnosis: Difficulty in walking, not elsewhere classified (R26.2);Pain Pain - Right/Left: Right Pain - part of body: Hip    Time: 1040-1120 PT Time Calculation (min) (ACUTE ONLY): 40 min   Charges:   PT Evaluation $PT Eval Moderate Complexity: 1 Mod PT Treatments $Therapeutic Exercise: 8-22 mins $Therapeutic Activity: 8-22 mins   PT G Codes:   PT G-Codes **NOT FOR INPATIENT CLASS** Functional  Assessment Tool Used: AM-PAC 6 Clicks Basic Mobility Functional Limitation: Mobility: Walking and moving around Mobility: Walking and Moving Around Current Status ([id number]): At least 40 percent but less than 60 percent impaired, limited or restricted Mobility: Walking and Moving Around Goal Status [phone]([id number]): At least 20 percent but less than 40 percent impaired, limited or restricted    Pulte Homesisele A Watt Geiler, PT 05/27/2017, 11:41 AM

## 2017-05-27 NOTE — Progress Notes (Signed)
Clinical Social Worker (CSW) received SNF consult. PT is recommending home health. RN case manager aware of above. Please reconsult if future social work needs arise. CSW signing off.   Kiaria Quinnell, LCSW [phone] 

## 2017-05-27 NOTE — Care Management Note (Signed)
Case Management Note  Patient Details  Name: James Finley MRN: [id number] Date of Birth: 1956/01/24  Subjective/Objective:  POD # 1 right THA. Met with patient and his wife at bedside. He lives with his wife. He has a walker.  Offered choice of home health agencies. [facility]erral to [facility] for [facility]. Pharmacy: [facility] [phone].Called Lovenox 40 mg # 14 no refills. PCP is Frazier Richards.                      Action/Plan:   Expected Discharge Date:                  Expected Discharge Plan:  [place]  In-House Referral:     Discharge planning Services  CM Consult  Post Acute Care Choice:  Home Health Choice offered to:  Patient, Spouse  DME Arranged:    DME Agency:     HH Arranged:  PT [place]:  [facility]  Status of Service:  In process, will continue to follow  If discussed at Long Length of Stay Meetings, dates discussed:    Additional Comments:  Jolly Mango, RN 05/27/2017, 2:40 PM

## 2017-05-27 NOTE — Progress Notes (Signed)
Medicated pt x 1 for pain, effective. Ate 75% of meal without nausea. A&Ox4.

## 2017-05-27 NOTE — [place] FL2 (Signed)
[place] MEDICAID FL2 LEVEL OF CARE SCREENING TOOL     IDENTIFICATION  Patient Name: James Finley Birthdate: 04-14-1956 Sex: male Admission Date (Current Location): 05/26/2017  [place] and [place] Number:  Chiropodist and Address:  [facility], [address] [place], [place] [address]      Provider Number: [id number]  Attending Physician Name and Address:  Kennedy Bucker, MD  Relative Name and Phone Number:       Current Level of Care: Hospital Recommended Level of Care: Skilled Nursing Facility Prior Approval Number:    Date Approved/Denied:   PASRR Number:  [id number])  Discharge Plan: SNF    Current Diagnoses: Patient Active Problem List   Diagnosis Date Noted  . Primary localized osteoarthritis of right hip 05/26/2017  . Primary osteoarthritis of left knee 08/07/2015    Orientation RESPIRATION BLADDER Height & Weight     Self, Time, Situation, Place  Normal Continent Weight: 194 lb (88 kg) Height:   (188 cm)  BEHAVIORAL SYMPTOMS/MOOD NEUROLOGICAL BOWEL NUTRITION STATUS      Continent Diet (Regular Diet )  AMBULATORY STATUS COMMUNICATION OF NEEDS Skin   Extensive Assist Verbally Surgical wounds (Incision: Right Hip. )                       Personal Care Assistance Level of Assistance  Bathing, Feeding, Dressing Bathing Assistance: Limited assistance Feeding assistance: Independent Dressing Assistance: Limited assistance     Functional Limitations Info  Sight, Hearing, Speech Sight Info: Adequate Hearing Info: Adequate Speech Info: Adequate    SPECIAL CARE FACTORS FREQUENCY  PT (By licensed PT), OT (By licensed OT)     PT Frequency:  (5) OT Frequency:  (5)            Contractures      Additional Factors Info  Code Status, Allergies Code Status Info:  (Full Code. ) Allergies Info:  (Ibuprofen, Percocet Oxycodone-acetaminophen)           Current Medications (05/27/2017):  This is the  current hospital active medication list Current Facility-Administered Medications  Medication Dose Route Frequency Provider Last Rate Last Dose  . 0.9 %  sodium chloride infusion   Intravenous Continuous Kennedy Bucker, MD 100 mL/hr at 05/26/17 2125    . acetaminophen (TYLENOL) tablet 650 mg  650 mg Oral Q6H PRN Kennedy Bucker, MD       Or  . acetaminophen (TYLENOL) suppository 650 mg  650 mg Rectal Q6H PRN Kennedy Bucker, MD      . alum & mag hydroxide-simeth (MAALOX/MYLANTA) 200-200-20 MG/5ML suspension 30 mL  30 mL Oral Q4H PRN Kennedy Bucker, MD      . bisacodyl (DULCOLAX) suppository 10 mg  10 mg Rectal Daily PRN Kennedy Bucker, MD      . ceFAZolin (ANCEF) IVPB 2g/100 mL premix  2 g Intravenous Q6H Kennedy Bucker, MD 200 mL/hr at 05/27/17 0550 2 g at 05/27/17 0550  . diphenhydrAMINE (BENADRYL) 12.5 MG/5ML elixir 12.5-25 mg  12.5-25 mg Oral Q4H PRN Kennedy Bucker, MD      . docusate sodium (COLACE) capsule 100 mg  100 mg Oral BID Kennedy Bucker, MD   100 mg at 05/26/17 2126  . enoxaparin (LOVENOX) injection 40 mg  40 mg Subcutaneous Q24H Kennedy Bucker, MD      . HYDROcodone-acetaminophen [facility]) 7.5-325 MG per tablet 1-2 tablet  1-2 tablet Oral Q4H PRN Kennedy Bucker, MD   2 tablet at 05/27/17 0243  .  magnesium citrate solution 1 Bottle  1 Bottle Oral Once PRN Kennedy BuckerMenz, Michael, MD      . magnesium hydroxide (MILK OF MAGNESIA) suspension 30 mL  30 mL Oral Daily PRN Kennedy BuckerMenz, Michael, MD      . magnesium oxide (MAG-OX) tablet 400 mg  400 mg Oral Daily PRN Kennedy BuckerMenz, Michael, MD      . menthol-cetylpyridinium (CEPACOL) lozenge 3 mg  1 lozenge Oral PRN Kennedy BuckerMenz, Michael, MD       Or  . phenol (CHLORASEPTIC) mouth spray 1 spray  1 spray Mouth/Throat PRN Kennedy BuckerMenz, Michael, MD      . methocarbamol (ROBAXIN) tablet 500 mg  500 mg Oral Q6H PRN Kennedy BuckerMenz, Michael, MD   500 mg at 05/27/17 0555   Or  . methocarbamol (ROBAXIN) 500 mg in dextrose 5 % 50 mL IVPB  500 mg Intravenous Q6H PRN Kennedy BuckerMenz, Michael, MD      . metoCLOPramide (REGLAN)  tablet 5-10 mg  5-10 mg Oral Q8H PRN Kennedy BuckerMenz, Michael, MD       Or  . metoCLOPramide (REGLAN) injection 5-10 mg  5-10 mg Intravenous Q8H PRN Kennedy BuckerMenz, Michael, MD      . morphine 2 MG/ML injection 2 mg  2 mg Intravenous Q2H PRN Kennedy BuckerMenz, Michael, MD   2 mg at 05/27/17 0555  . ondansetron (ZOFRAN) tablet 4 mg  4 mg Oral Q6H PRN Kennedy BuckerMenz, Michael, MD       Or  . ondansetron [facility](ZOFRAN) injection 4 mg  4 mg Intravenous Q6H PRN Kennedy BuckerMenz, Michael, MD   4 mg at 05/26/17 2243  . polyvinyl alcohol (LIQUIFILM TEARS) 1.4 % ophthalmic solution 1 drop  1 drop Both Eyes PRN Kennedy BuckerMenz, Michael, MD      . pravastatin (PRAVACHOL) tablet 20 mg  20 mg Oral QPM Kennedy BuckerMenz, Michael, MD      . vitamin C (ASCORBIC ACID) tablet 500 mg  500 mg Oral Daily Kennedy BuckerMenz, Michael, MD      . zolpidem (AMBIEN) tablet 5 mg  5 mg Oral QHS PRN,MR X 1 Kennedy BuckerMenz, Michael, MD         Discharge Medications: Please see discharge summary for a list of discharge medications.  Relevant Imaging Results:  Relevant Lab Results:   Additional Information  (SSN: [id number])  Geni Skorupski, Darleen CrockerBailey M, LCSW

## 2017-05-27 NOTE — Progress Notes (Signed)
Physical Therapy Treatment Patient Details Name: James Finley MRN: [id number] DOB: 09/03/1956 Today's Date: 05/27/2017    History of Present Illness James Finley is a 61 yo male 1 Day Post-Op Procedure(s) (LRB):    PT Comments    Pt drowsy this afternoon and continues with nausea.  Pt more alert with ambulation and was able to increase gait distance around nursing station with step-through gait pattern.  Pt's wife at bedside and educated on anterior hip precautions.  Pt tolerating all therex well.  Cont with POC.    Follow Up Recommendations  Home health PT     Equipment Recommendations  Rolling walker with 5" wheels    Recommendations for Other Services       Precautions / Restrictions Precautions Precautions: Anterior Hip;Fall Precaution Booklet Issued: Yes (comment) Restrictions RLE Weight Bearing: Weight bearing as tolerated    Mobility  Bed Mobility Overal bed mobility: Needs Assistance Bed Mobility: Sit to Supine     Supine to sit: Min assist Sit to supine: Min assist   General bed mobility comments: Pt with difficulty scooting to EOB, requiring extra time/effort using bed rail for assist.  Transfers Overall transfer level: Needs assistance Equipment used: Rolling walker (2 wheeled) Transfers: Sit to/from Stand Sit to Stand: Supervision         General transfer comment: Rising on first attempt and good balance with hand transfer from bed to RW.  Ambulation/Gait Ambulation/Gait assistance: Supervision Ambulation Distance (Feet): 200 Feet Assistive device: Rolling walker (2 wheeled) Gait Pattern/deviations: Step-through pattern;Antalgic     General Gait Details: Steady gait with step through gait pattern, using RW to offweight R LE during stance.   Stairs            Wheelchair Mobility    Modified Rankin (Stroke Patients Only)       Balance Overall balance assessment: Modified Independent                                           Cognition Arousal/Alertness: Awake/alert Behavior During Therapy: WFL for tasks assessed/performed (drowsy) Overall Cognitive Status: Within Functional Limits for tasks assessed                                 General Comments: Pt continues with drowziness all day and nausea, reporting vomiting for a total of 2x today      Exercises Total Joint Exercises Ankle Circles/Pumps: AROM;Strengthening;10 reps Finley Sets: AROM;Strengthening;10 reps Gluteal Sets: AROM;Strengthening;10 reps Heel Slides: AROM;Strengthening;Both;10 reps Hip ABduction/ADduction: AROM;Strengthening;Right;10 reps James Finley: AAROM;Strengthening;Right;10 reps    General Comments        Pertinent Vitals/Pain      Home Living                      Prior Function            PT Goals (current goals can now be found in the care plan section) Acute Rehab PT Goals Patient Stated Goal: To walk. PT Goal Formulation: With patient Time For Goal Achievement: 06/03/17 Potential to Achieve Goals: Good Progress towards PT goals: Progressing toward goals    Frequency    BID      PT Plan Current plan remains appropriate    Co-evaluation  AM-PAC PT "6 Clicks" Daily Activity  Outcome Measure  Difficulty turning over in bed (including adjusting bedclothes, sheets and blankets)?: A Lot Difficulty moving from lying on back to sitting on the side of the bed? : A Lot Difficulty sitting down on and standing up from a chair with arms (e.g., wheelchair, bedside commode, etc,.)?: A Little Help needed moving to and from a bed to chair (including a wheelchair)?: A Little Help needed walking in hospital room?: A Little Help needed climbing 3-5 steps with a railing? : A Lot 6 Click Score: 15    End of Session Equipment Utilized During Treatment: Gait belt Activity Tolerance: Patient tolerated treatment well Patient left: in bed;with call bell/phone within  reach;with family/visitor present Nurse Communication: Mobility status PT Visit Diagnosis: Difficulty in walking, not elsewhere classified (R26.2);Pain Pain - Right/Left: Right Pain - part of body: Hip     Time: [id number] PT Time Calculation (min) (ACUTE ONLY): 24 min  Charges:  $Gait Training: 8-22 mins $Therapeutic Exercise: 8-22 mins                    G Codes:  Functional Assessment Tool Used: AM-PAC 6 Clicks Basic Mobility Functional Limitation: Mobility: Walking and moving around Mobility: Walking and Moving Around Current Status ([id number](G8978): At least 40 percent but less than 60 percent impaired, limited or restricted Mobility: Walking and Moving Around Goal Status [phone](G8979): At least 20 percent but less than 40 percent impaired, limited or restricted    P[place] Homesisele A Gerda Yin, PT 05/27/2017, 4:45 PM

## 2017-05-27 NOTE — Progress Notes (Signed)
Patient family approached this Probation officer with concerns because he hasn't received any medication. Requested to be put in bed. Education given regarding alertness of patient and parameters not met. O2 sats dropped to 89% at rest in chair. Added 2L via [place]. Checked BS to reveal 153. Encourage fluids

## 2017-05-27 NOTE — Progress Notes (Signed)
   Subjective: 1 Day Post-Op Procedure(s) (LRB): TOTAL HIP ARTHROPLASTY ANTERIOR APPROACH (Right) Patient reports pain as moderate.   Patient is well, and has had no acute complaints or problems Denies any CP, SOB, ABD pain. We will continue therapy today.  Plan is to go Home after hospital stay.  Objective: Vital signs in last 24 hours: Temp:  [97.8 F (36.6 C)-98.6 F (37 C)] 98 F (36.7 C) (09/11 2316) Pulse Rate:  [72-96] 96 (09/11 2316) Resp:  [14-21] 18 (09/11 2316) BP: (101-162)/(60-105) 162/70 (09/11 2316) SpO2:  [96 %-100 %] 100 % (09/11 2316) FiO2 (%):  [21 %] 21 % (09/11 2033) Weight:  [88 kg (194 lb)] 88 kg (194 lb) (09/11 2038)  Intake/Output from previous day: 09/11 0701 - 09/12 0700 In: 2103.3 [I.V.:2003.3; IV Piggyback:100] Out: 2425 [Urine:1925; Blood:500] Intake/Output this shift: No intake/output data recorded.   Recent Labs  05/26/17 2106 05/27/17 0447  HGB 12.2* 12.3*    Recent Labs  05/26/17 2106 05/27/17 0447  WBC 15.5* 11.3*  RBC 4.17* 4.15*  HCT 35.7* 35.6*  PLT 178 166    Recent Labs  05/26/17 2106 05/27/17 0447  NA  --  136  K  --  3.7  CL  --  104  CO2  --  26  BUN  --  10  CREATININE 0.61 0.60*  GLUCOSE  --  191*  CALCIUM  --  8.1*   No results for input(s): LABPT, INR in the last 72 hours.  EXAM General - Patient is Alert, Appropriate and Oriented Extremity - Neurovascular intact Sensation intact distally Intact pulses distally Dorsiflexion/Plantar flexion intact No cellulitis present Compartment soft Dressing - dressing C/D/I and moderate drainage, dressing changed Motor Function - intact, moving foot and toes well on exam.   Past Medical History:  Diagnosis Date  . Arthritis   . Diabetes mellitus without complication (HCC)    diet controlled  . Hypercholesteremia unk  . Muscle cramps     Assessment/Plan:   1 Day Post-Op Procedure(s) (LRB): TOTAL HIP ARTHROPLASTY ANTERIOR APPROACH (Right) Active  Problems:   Primary localized osteoarthritis of right hip  Estimated body mass index is 24.91 kg/m as calculated from the following:   Height as of this encounter: 6\' 2"  (1.88 m).   Weight as of this encounter: 88 kg (194 lb). Advance diet Up with therapy  Needs BM Recheck labs in the am CM to assist with discharge  DVT Prophylaxis - Lovenox, Foot Pumps and TED hose Weight-Bearing as tolerated to right leg   T. [place] Neighborhris Gaines, PA-C [facility] 05/27/2017, 8:21 AM

## 2017-05-27 NOTE — Discharge Summary (Signed)
Physician Discharge Summary  Patient ID: James Finley MRN: [id number] DOB/AGE: 61-29-1957 61 y.o.  Admit date: 05/26/2017 Discharge date: 05/27/2017  Admission Diagnoses:  primary localized osteoarthritis of right hip   Discharge Diagnoses: Patient Active Problem List   Diagnosis Date Noted  . Primary localized osteoarthritis of right hip 05/26/2017  . Primary osteoarthritis of left knee 08/07/2015    Past Medical History:  Diagnosis Date  . Arthritis   . Diabetes mellitus without complication (HCC)    diet controlled  . Hypercholesteremia unk  . Muscle cramps      Transfusion: none   Consultants (if any):   Discharged Condition: Improved  Hospital Course: Burhanuddin Kohlmann is an 61 y.o. male who was admitted 05/26/2017 with a diagnosis of right hip osteoarthritis and went to the operating room on 05/26/2017 and underwent the above named procedures.    Surgeries: Procedure(s): TOTAL HIP ARTHROPLASTY ANTERIOR APPROACH on 05/26/2017 Patient tolerated the surgery well. Taken to PACU where she was stabilized and then transferred to the orthopedic floor.  Started on Lovenox 40 q 24 hrs. Foot pumps applied bilaterally at 80 mm. Heels elevated on bed with rolled towels. No evidence of DVT. Negative Homan. Physical therapy started on day #1 for gait training and transfer. OT started day #1 for ADL and assisted devices.  Patient's foley was d/c on day #1. Patient's IV was d/c on day #2.  On post op day #2 patient was stable and ready for discharge to home with HHPT.  Implants: Medact Mpact DM 54 mm with liner.  DePuy Actis 8 standard stem with +5 mm ceramic head  He was given perioperative antibiotics:  Anti-infectives    Start     Dose/Rate Route Frequency Ordered Stop   05/26/17 2300  ceFAZolin (ANCEF) IVPB 2g/100 mL premix     2 g 200 mL/hr over 30 Minutes Intravenous Every 6 hours 05/26/17 2032 05/27/17 1109   05/26/17 1237  ceFAZolin (ANCEF) 2-4 GM/100ML-% IVPB     Comments:  Agnes Lawrence  : cabinet override      05/26/17 1237 05/26/17 1645   05/25/17 2200  ceFAZolin (ANCEF) IVPB 2g/100 mL premix     2 g 200 mL/hr over 30 Minutes Intravenous  Once 05/25/17 2159 05/26/17 1720    .  He was given sequential compression devices, early ambulation, and lovneox for DVT prophylaxis.  He benefited maximally from the hospital stay and there were no complications.    Recent vital signs:  Vitals:   05/27/17 1513 05/27/17 1925  BP: (!) 150/89 125/90  Pulse: (!) 111 (!) 110  Resp:  18  Temp: 100 F (37.8 C) 99.5 F (37.5 C)  SpO2: (!) 89% 99%    Recent laboratory studies:  Lab Results  Component Value Date   HGB 12.3 (L) 05/27/2017   HGB 12.2 (L) 05/26/2017   HGB 13.9 05/13/2017   Lab Results  Component Value Date   WBC 11.3 (H) 05/27/2017   PLT 166 05/27/2017   Lab Results  Component Value Date   INR 0.99 05/13/2017   Lab Results  Component Value Date   NA 136 05/27/2017   K 3.7 05/27/2017   CL 104 05/27/2017   CO2 26 05/27/2017   BUN 10 05/27/2017   CREATININE 0.60 (L) 05/27/2017   GLUCOSE 191 (H) 05/27/2017    Discharge Medications:   Allergies as of 05/27/2017      Reactions   Ibuprofen Other (See Comments)   Upsets stomach  Percocet [oxycodone-acetaminophen] Nausea And Vomiting      Medication List    STOP taking these medications   naproxen sodium 220 MG tablet Commonly known as:  ANAPROX     TAKE these medications   ARTIFICIAL TEARS OP Place 1 drop into both eyes 2 (two) times daily as needed (dry eyes).   b complex vitamins tablet Take 1 tablet by mouth daily.   docusate sodium 100 MG capsule Commonly known as:  COLACE Take 1 capsule (100 mg total) by mouth 2 (two) times daily.   enoxaparin 40 MG/0.4ML injection Commonly known as:  LOVENOX Inject 0.4 mLs (40 mg total) into the skin daily.   HYDROcodone-acetaminophen 7.5-325 MG tablet Commonly known as:  NORCO Take 1-2 tablets by mouth every 6  (six) hours as needed (breakthrough pain).   magnesium oxide 400 MG tablet Commonly known as:  MAG-OX Take 400 mg by mouth daily as needed (leg cramps).   meloxicam 7.5 MG tablet Commonly known as:  MOBIC Take 7.5 mg by mouth 2 (two) times daily as needed for pain.   methocarbamol 500 MG tablet Commonly known as:  ROBAXIN Take 1 tablet (500 mg total) by mouth every 6 (six) hours as needed for muscle spasms.   pravastatin 20 MG tablet Commonly known as:  PRAVACHOL Take 20 mg by mouth every evening.   vitamin C 500 MG tablet Commonly known as:  ASCORBIC ACID Take 500 mg by mouth daily.            Durable Medical Equipment        Start     Ordered   05/26/17 2033  DME Walker rolling  Once    Question:  Patient needs a walker to treat with the following condition  Answer:  S/P total hip arthroplasty   05/26/17 2032   05/26/17 2033  DME 3 n 1  Once     05/26/17 2032   05/26/17 2033  DME Bedside commode  Once    Question:  Patient needs a bedside commode to treat with the following condition  Answer:  S/P total hip arthroplasty   05/26/17 2032       Discharge Care Instructions        Start     Ordered   05/28/17 0000  docusate sodium (COLACE) 100 MG capsule  2 times daily    Question:  Supervising Provider  Answer:  Kennedy BuckerMENZ, MICHAEL   05/27/17 2154   05/28/17 0000  enoxaparin (LOVENOX) 40 MG/0.4ML injection  Every 24 hours    Question:  Supervising Provider  Answer:  Kennedy BuckerMENZ, MICHAEL   05/27/17 2154   05/27/17 0000  methocarbamol (ROBAXIN) 500 MG tablet  Every 6 hours PRN    Question:  Supervising Provider  Answer:  Kennedy BuckerMENZ, MICHAEL   05/27/17 2154   05/27/17 0000  HYDROcodone-acetaminophen (NORCO) 7.5-325 MG tablet  Every 6 hours PRN    Question:  Supervising Provider  Answer:  Kennedy BuckerMENZ, MICHAEL   05/27/17 2154      Diagnostic Studies: Dg Hip Operative Unilat W Or W/o Pelvis Right  Result Date: 05/26/2017 CLINICAL DATA:  Right hip replacement EXAM: OPERATIVE RIGHT HIP  WITH PELVIS COMPARISON:  None. FLUOROSCOPY TIME:  Radiation Exposure Index (as provided by the fluoroscopic device): Not available If the device does not provide the exposure index: Fluoroscopy Time:  12 seconds Number of Acquired Images:  3 FINDINGS: Initial image demonstrates significant degenerative changes in remodeling of the right femoral head. Right hip prosthesis is  subsequently noted in satisfactory position. IMPRESSION: Status post right hip replacement Electronically Signed   By: Alcide Clever M.D.   On: 05/26/2017 20:14   Dg Hip Unilat W Or W/o Pelvis 2-3 Views Right  Result Date: 05/26/2017 CLINICAL DATA:  Status post right hip replacement EXAM: DG HIP (WITH OR WITHOUT PELVIS) 2-3V RIGHT COMPARISON:  None. FINDINGS: Right hip replacement is noted in satisfactory position. No acute bony or soft tissue abnormality is noted. IMPRESSION: Status post right hip replacement Electronically Signed   By: Alcide Clever M.D.   On: 05/26/2017 18:51    Disposition: 01-Home or Self Care    Follow-up Information    Kennedy Bucker, MD. Go in 2 week(s).   Specialty:  Orthopedic Surgery Contact information: [address] [place] [address] [phone]            Signed: Patience Musca 05/27/2017, 9:55 PM

## 2017-05-27 NOTE — Anesthesia Postprocedure Evaluation (Signed)
Anesthesia Post Note  Patient: Domenica FailRickey Hosier  Procedure(s) Performed: Procedure(s) (LRB): TOTAL HIP ARTHROPLASTY ANTERIOR APPROACH (Right)  Patient location during evaluation: Nursing Unit Anesthesia Type: Spinal Level of consciousness: oriented and awake and alert Pain management: pain level controlled Vital Signs Assessment: post-procedure vital signs reviewed and stable Respiratory status: spontaneous breathing and respiratory function stable Cardiovascular status: blood pressure returned to baseline and stable Postop Assessment: no headache and no backache Anesthetic complications: no     Last Vitals:  Vitals:   05/26/17 2219 05/26/17 2316  BP: (!) 160/75 (!) 162/70  Pulse: 94 96  Resp: 19 18  Temp: 37 C 36.7 C  SpO2: 99% 100%    Last Pain:  Vitals:   05/27/17 0555  TempSrc:   PainSc: 8                  Michaele OfferSavage,  [employer] [employer] A

## 2017-05-28 ENCOUNTER — Inpatient Hospital Stay: Payer: PRIVATE HEALTH INSURANCE

## 2017-05-28 LAB — URINALYSIS, COMPLETE (UACMP) WITH MICROSCOPIC
BILIRUBIN URINE: NEGATIVE
GLUCOSE, UA: 250 mg/dL — AB
Ketones, ur: NEGATIVE mg/dL
LEUKOCYTES UA: NEGATIVE
NITRITE: NEGATIVE
Protein, ur: NEGATIVE mg/dL
SPECIFIC GRAVITY, URINE: 1.015 (ref 1.005–1.030)
Squamous Epithelial / LPF: NONE SEEN
pH: 7 (ref 5.0–8.0)

## 2017-05-28 LAB — CBC
HEMATOCRIT: 36.3 % — AB (ref 40.0–52.0)
Hemoglobin: 12.3 g/dL — ABNORMAL LOW (ref 13.0–18.0)
MCH: 29.2 pg (ref 26.0–34.0)
MCHC: 33.9 g/dL (ref 32.0–36.0)
MCV: 86.2 fL (ref 80.0–100.0)
Platelets: 151 10*3/uL (ref 150–440)
RBC: 4.21 MIL/uL — ABNORMAL LOW (ref 4.40–5.90)
RDW: 13.4 % (ref 11.5–14.5)
WBC: 12.7 10*3/uL — AB (ref 3.8–10.6)

## 2017-05-28 LAB — BASIC METABOLIC PANEL
ANION GAP: 5 (ref 5–15)
BUN: 13 mg/dL (ref 6–20)
CALCIUM: 8.3 mg/dL — AB (ref 8.9–10.3)
CO2: 28 mmol/L (ref 22–32)
CREATININE: 0.84 mg/dL (ref 0.61–1.24)
Chloride: 100 mmol/L — ABNORMAL LOW (ref 101–111)
Glucose, Bld: 187 mg/dL — ABNORMAL HIGH (ref 65–99)
Potassium: 3.9 mmol/L (ref 3.5–5.1)
SODIUM: 133 mmol/L — AB (ref 135–145)

## 2017-05-28 LAB — GLUCOSE, CAPILLARY
GLUCOSE-CAPILLARY: 167 mg/dL — AB (ref 65–99)
Glucose-Capillary: 171 mg/dL — ABNORMAL HIGH (ref 65–99)
Glucose-Capillary: 161 mg/dL — ABNORMAL HIGH (ref 65–99)

## 2017-05-28 LAB — TROPONIN I
TROPONIN I: 0.04 ng/mL — AB (ref <0.03)
TROPONIN I: 0.05 ng/mL — AB (ref <0.03)
Troponin I: 0.04 ng/mL (ref <0.03)

## 2017-05-28 LAB — TSH: TSH: 0.607 u[IU]/mL (ref 0.350–4.500)

## 2017-05-28 MED ORDER — IOPAMIDOL (ISOVUE-370) INJECTION 76%
75.0000 mL | Freq: Once | INTRAVENOUS | Status: AC | PRN
  Administered 2017-05-28: 75 mL via INTRAVENOUS

## 2017-05-28 MED ORDER — INSULIN ASPART 100 UNIT/ML ~~LOC~~ SOLN
0.0000 [IU] | Freq: Three times a day (TID) | SUBCUTANEOUS | Status: DC
  Administered 2017-05-28 – 2017-05-30 (×7): 2 [IU] via SUBCUTANEOUS
  Filled 2017-05-28 (×7): qty 1

## 2017-05-28 MED ORDER — METOPROLOL TARTRATE 25 MG PO TABS
12.5000 mg | ORAL_TABLET | Freq: Two times a day (BID) | ORAL | Status: DC
  Administered 2017-05-28 – 2017-05-29 (×4): 12.5 mg via ORAL
  Filled 2017-05-28 (×4): qty 1

## 2017-05-28 NOTE — Progress Notes (Signed)
Pt resting comfortably semi-fowlers in bed with wife supportive and visiting at bedside. No s/sx distress and no c/o such. Pt verbalizes that he is "tired" but feels "ok" this afternoon. Pt denies pain. Will continue to monitor.

## 2017-05-28 NOTE — Progress Notes (Signed)
Pt resting comfortably semifowlers in bed. Pt denies chest pain or SOB. Dr. Rosita KeaMenz called a medical consult to eval episode. EKG performed per order. Pt denies discomfort at this time. BP 126/84, temp 97.9, pulse 89, 94% 2 lpm. Dr. Allena KatzPatel in to see patient. Rept to MD regarding pt becoming diaphoretic and tachycardic while ambulating with PT in hall. Dr. Allena KatzPatel given EKG for review when completed. Dr. Allena KatzPatel aware that patient had episodes of desaturation per night RN report yesterday and pt has been tachycardic in the low 100's at times since surgery. Obtaining tropinins per order. Dr. Allena KatzPatel has ordered CT of chest to rule out PE. Will continue to monitor.

## 2017-05-28 NOTE — Care Management Note (Signed)
Case Management Note  Patient Details  Name: James Finley MRN: [id number] Date of Birth: 06/10/1956  Subjective/Objective:   Cost of Lovenox is $ 10.00.Patient updated.                  Action/Plan:   Expected Discharge Date:                  Expected Discharge Plan:  Home w Home Health Services  In-House Referral:     Discharge planning Services  CM Consult  Post Acute Care Choice:  Home Health Choice offered to:  Patient, Spouse  DME Arranged:    DME Agency:     HH Arranged:  PT HH Agency:  [facility]  Status of Service:  In process, will continue to follow  If discussed at Long Length of Stay Meetings, dates discussed:    Additional Comments:  Marily MemosLisa M Jeneen Doutt, RN 05/28/2017, 9:42 AM

## 2017-05-28 NOTE — Progress Notes (Signed)
Rept to Dr. Allena KatzPatel result of tropinin 0.4, CT result. Per his order, tele applied and will obtain urinalysis. Pt resting comfortably semifowlers in bed. No further episodes. Will continue to monitor.

## 2017-05-28 NOTE — Progress Notes (Signed)
Pt resting comfortably in bed. No s/sx distress and no c/o such. U/A obtained per order. Vital signs remain stable. Pt states "I feel okay."  States, "I am just tired". Pt tele NSR with occasional sinus tach. Pt repts the he has been performing IS every hour. Pt denies pain.  Will rept all event of shift to oncoming RN.

## 2017-05-28 NOTE — Progress Notes (Signed)
Notified Dr. Rosita KeaMenz that patient was tachycardic and diaphoretic while ambulating with PT

## 2017-05-28 NOTE — H&P (Signed)
[facility] - [place] [facility]   PATIENT NAME: James Finley    MR#:  [id number]  DATE OF BIRTH:  02/02/1956  DATE OF ADMISSION:  05/26/2017  PRIMARY CARE PHYSICIAN: Lauro RegulusAnderson, Marshall W, MD   REQUESTING/REFERRING PHYSICIAN: MenZ MD  CHIEF COMPLAINT:  No chief complaint on file.   HISTORY OF PRESENT ILLNESS: James Finley  is a 61 y.o. male with a known history of osteoarthritis, diabetes diet-controlled, hypercholesterolemia who was admitted on September 11 and underwent total hip arthroplasty. Patient yesterday was noted to have oxygen sats that drop. He had to be placed on oxygen. Today he was ambulated and he became tachycardic and diaphoretic. Were asked to see the patient. Patient states that he does not have any chest pain he does not feel short of breath. Denies any nausea vomiting or diarrhea  PAST MEDICAL HISTORY:   Past Medical History:  Diagnosis Date  . Arthritis   . Diabetes mellitus without complication (HCC)    diet controlled  . Hypercholesteremia unk  . Muscle cramps     PAST SURGICAL HISTORY: Past Surgical History:  Procedure Laterality Date  . FRACTURE SURGERY Right    leg  . HERNIA REPAIR Bilateral    Inguinal Hernia Repair  . JOINT REPLACEMENT Left    hip  . KNEE CLOSED REDUCTION Left 09/06/2015   Procedure: CLOSED MANIPULATION KNEE;  Surgeon: Kennedy BuckerMichael Menz, MD;  Location: [facility] ORS;  Service: Orthopedics;  Laterality: Left;  . TOTAL HIP ARTHROPLASTY Right 05/26/2017   Procedure: TOTAL HIP ARTHROPLASTY ANTERIOR APPROACH;  Surgeon: Kennedy BuckerMenz, Michael, MD;  Location: [facility] ORS;  Service: Orthopedics;  Laterality: Right;  . TOTAL KNEE ARTHROPLASTY Left 08/07/2015   Procedure: TOTAL KNEE ARTHROPLASTY;  Surgeon: Kennedy BuckerMichael Menz, MD;  Location: [facility] ORS;  Service: Orthopedics;  Laterality: Left;    SOCIAL HISTORY:  Social History  Substance Use Topics  . Smoking status: Former Smoker    Packs/day: 1.00    Types: Cigarettes    Quit date:  05/27/2007  . Smokeless tobacco: Never Used  . Alcohol use No    FAMILY HISTORY:  Family History  Problem Relation Age of Onset  . Hypertension Mother   . Hypertension Father     DRUG ALLERGIES:  Allergies  Allergen Reactions  . Ibuprofen Other (See Comments)    Upsets stomach   . Percocet [Oxycodone-Acetaminophen] Nausea And Vomiting    REVIEW OF SYSTEMS:   CONSTITUTIONAL: No fever, fatigue or weakness.  EYES: No blurred or double vision.  EARS, NOSE, AND THROAT: No tinnitus or ear pain.  RESPIRATORY: No cough, shortness of breath, wheezing or hemoptysis.  CARDIOVASCULAR: No chest pain, orthopnea, edema.  GASTROINTESTINAL: No nausea, vomiting, diarrhea or abdominal pain.  GENITOURINARY: No dysuria, hematuria.  ENDOCRINE: No polyuria, nocturia,  HEMATOLOGY: No anemia, easy bruising or bleeding SKIN: No rash or lesion. MUSCULOSKELETAL: No joint pain or arthritis.   NEUROLOGIC: No tingling, numbness, weakness.  PSYCHIATRY: No anxiety or depression.   MEDICATIONS AT HOME:  Prior to Admission medications   Medication Sig Start Date End Date Taking? Authorizing Provider  b complex vitamins tablet Take 1 tablet by mouth daily.   Yes [provider]  Hypromellose (ARTIFICIAL TEARS OP) Place 1 drop into both eyes 2 (two) times daily as needed (dry eyes).    Yes [provider]  magnesium oxide (MAG-OX) 400 MG tablet Take 400 mg by mouth daily as needed (leg cramps).   Yes [provider]  meloxicam (MOBIC) 7.5 MG  tablet Take 7.5 mg by mouth 2 (two) times daily as needed for pain.    Yes [provider]  naproxen sodium (ANAPROX) 220 MG tablet Take 440 mg by mouth 2 (two) times daily with a meal.    Yes [provider]  pravastatin (PRAVACHOL) 20 MG tablet Take 20 mg by mouth every evening.    Yes [provider]  vitamin C (ASCORBIC ACID) 500 MG tablet Take 500 mg by mouth daily.   Yes [provider]  docusate sodium  (COLACE) 100 MG capsule Take 1 capsule (100 mg total) by mouth 2 (two) times daily. 05/28/17   Evon Slack, PA-C  enoxaparin (LOVENOX) 40 MG/0.4ML injection Inject 0.4 mLs (40 mg total) into the skin daily. 05/28/17   Evon Slack, PA-C  HYDROcodone-acetaminophen (NORCO) 7.5-325 MG tablet Take 1-2 tablets by mouth every 6 (six) hours as needed (breakthrough pain). 05/27/17   Evon Slack, PA-C  methocarbamol (ROBAXIN) 500 MG tablet Take 1 tablet (500 mg total) by mouth every 6 (six) hours as needed for muscle spasms. 05/27/17   Evon Slack, PA-C      PHYSICAL EXAMINATION:   VITAL SIGNS: Blood pressure 136/74, pulse (!) 133, temperature 99.4 F (37.4 C), temperature source Oral, resp. rate 17, height  (1.88 m), weight 194 lb (88 kg), SpO2 91 %.  GENERAL:  61 y.o.-year-old patient lying in the bed with no acute distress.  EYES: Pupils equal, round, reactive to light and accommodation. No scleral icterus. Extraocular muscles intact.  HEENT: Head atraumatic, normocephalic. Oropharynx and nasopharynx clear.  NECK:  Supple, no jugular venous distention. No thyroid enlargement, no tenderness.  LUNGS: Normal breath sounds bilaterally, no wheezing, rales,rhonchi or crepitation. No use of accessory muscles of respiration.  CARDIOVASCULAR: S1, S2 normal. No murmurs, rubs, or gallops.  ABDOMEN: Soft, nontender, nondistended. Bowel sounds present. No organomegaly or mass.  EXTREMITIES: No pedal edema, cyanosis, or clubbing.  NEUROLOGIC: Cranial nerves II through XII are intact. Muscle strength 5/5 in all extremities. Sensation intact. Gait not checked.  PSYCHIATRIC: The patient is alert and oriented x 3.  SKIN: No obvious rash, lesion, or ulcer.   LABORATORY PANEL:   CBC  Recent Labs Lab 05/26/17 2106 05/27/17 0447 05/28/17 0559  WBC 15.5* 11.3* 12.7*  HGB 12.2* 12.3* 12.3*  HCT 35.7* 35.6* 36.3*  PLT 178 166 151  MCV 85.6 85.7 86.2  MCH 29.2 29.5 29.2  MCHC 34.2 34.4  33.9  RDW 13.3 13.5 13.4   ------------------------------------------------------------------------------------------------------------------  Chemistries   Recent Labs Lab 05/26/17 2106 05/27/17 0447 05/28/17 0559  NA  --  136 133*  K  --  3.7 3.9  CL  --  104 100*  CO2  --  26 28  GLUCOSE  --  191* 187*  BUN  --  10 13  CREATININE 0.61 0.60* 0.84  CALCIUM  --  8.1* 8.3*   ------------------------------------------------------------------------------------------------------------------ estimated creatinine clearance is 108.7 mL/min (by C-G formula based on SCr of 0.84 mg/dL). ------------------------------------------------------------------------------------------------------------------ No results for input(s): TSH, T4TOTAL, T3FREE, THYROIDAB in the last 72 hours.  Invalid input(s): FREET3   Coagulation profile No results for input(s): INR, PROTIME in the last 168 hours. ------------------------------------------------------------------------------------------------------------------- No results for input(s): DDIMER in the last 72 hours. -------------------------------------------------------------------------------------------------------------------  Cardiac Enzymes No results for input(s): CKMB, TROPONINI, MYOGLOBIN in the last 168 hours.  Invalid input(s): CK ------------------------------------------------------------------------------------------------------------------ Invalid input(s): POCBNP  ---------------------------------------------------------------------------------------------------------------  Urinalysis    Component Value Date/Time   COLORURINE YELLOW (  A) 05/13/2017 0949   APPEARANCEUR CLEAR (A) 05/13/2017 0949   LABSPEC 1.013 05/13/2017 0949   PHURINE 7.0 05/13/2017 0949   GLUCOSEU NEGATIVE 05/13/2017 0949   HGBUR NEGATIVE 05/13/2017 0949   BILIRUBINUR NEGATIVE 05/13/2017 0949   KETONESUR NEGATIVE 05/13/2017 0949   PROTEINUR NEGATIVE  05/13/2017 0949   NITRITE NEGATIVE 05/13/2017 0949   LEUKOCYTESUR NEGATIVE 05/13/2017 0949     RADIOLOGY: Dg Hip Operative Unilat W Or W/o Pelvis Right  Result Date: 05/26/2017 CLINICAL DATA:  Right hip replacement EXAM: OPERATIVE RIGHT HIP WITH PELVIS COMPARISON:  None. FLUOROSCOPY TIME:  Radiation Exposure Index (as provided by the fluoroscopic device): Not available If the device does not provide the exposure index: Fluoroscopy Time:  12 seconds Number of Acquired Images:  3 FINDINGS: Initial image demonstrates significant degenerative changes in remodeling of the right femoral head. Right hip prosthesis is subsequently noted in satisfactory position. IMPRESSION: Status post right hip replacement Electronically Signed   By: Alcide Clever M.D.   On: 05/26/2017 20:14   Dg Hip Unilat W Or W/o Pelvis 2-3 Views Right  Result Date: 05/26/2017 CLINICAL DATA:  Status post right hip replacement EXAM: DG HIP (WITH OR WITHOUT PELVIS) 2-3V RIGHT COMPARISON:  None. FINDINGS: Right hip replacement is noted in satisfactory position. No acute bony or soft tissue abnormality is noted. IMPRESSION: Status post right hip replacement Electronically Signed   By: Alcide Clever M.D.   On: 05/26/2017 18:51    EKG: Orders placed or performed during the hospital encounter of 05/26/17  . EKG 12-Lead  . EKG 12-Lead    IMPRESSION AND PLAN: Patient is a 61 year old status post total hip arthroplasty now with diaphoresis tachycardia and hypoxia  1. Diaphoresis tachycardia and hypoxia I will obtain a stat CT scan per pulmonary embolism to rule out PE We will also obtain EKG, as well as cardiac enzymes Place patient on telemetry  2. Diet control diabetes continue monitor blood sugar Start patient on sliding scale insulin  3. Mild leukocytosis we'll see what the CT scan shows will obtain a urinalysis  4. Misc: Into new Lovenox for DVT prophylaxis    All the records are reviewed and case discussed with ED  provider. Management plans discussed with the patient, family and they are in agreement.  CODE STATUS:    Code Status Orders        Start     Ordered   05/26/17 2033  Full code  Continuous     05/26/17 2032    Code Status History    Date Active Date Inactive Code Status Order ID Comments User Context   08/07/2015 10:44 AM 08/10/2015  7:38 PM Full Code [id number]  Kennedy Bucker, MD Inpatient       TOTAL TIME TAKING CARE OF THIS PATIENT: 55 minutes.    Auburn Bilberry M.D on 05/28/2017 at 9:12 AM  Between 7am to 6pm - Pager - [phone]  After 6pm go to [email or web address] - password [email or web address] [facility]  [facility]  Office  [phone]  CC: Primary care physician; Lauro Regulus, MD

## 2017-05-28 NOTE — Progress Notes (Signed)
Physical Therapy Treatment Patient Details Name: James Finley MRN: [id number] DOB: 05/24/1956 Today's Date: 05/28/2017    History of Present Illness James FailRickey Prevost is a 61 yo male 1 Day Post-Op Procedure(s) (LRB):    PT Comments    Pt agreeable to PT; presents lethargic/fatigued with R hip pain 6/10. Initial O2 saturation 77% checked via monitor and Dinamapp with same result; heart rate 100 beats per minute. Nursing consulted; instructed to place on 1 L O2. After several minutes increases to 89-92%, inconsistent. Pt agreeable to up to chair. Min A for Right lower extremity supine to sit. Pt sits edge of bed without assist, but worsening malaise and O2 saturation decreases to 82% without improvement post 5 minutes. Pt wishes return to bed with Min A. Continue PT to progress endurance and strength to improve out of bed tolerance and functional mobility.    Follow Up Recommendations  Home health PT;Other (comment) (dependent on pt functional mobility; may need SNF)     Equipment Recommendations  Rolling walker with 5" wheels    Recommendations for Other Services       Precautions / Restrictions Precautions Precautions: Anterior Hip;Fall Restrictions Weight Bearing Restrictions: Yes RLE Weight Bearing: Weight bearing as tolerated    Mobility  Bed Mobility Overal bed mobility: Needs Assistance Bed Mobility: Supine to Sit;Sit to Supine     Supine to sit: Min assist Sit to supine: Min assist   General bed mobility comments: Assist for RLE; use of trapeze to move upward in bed  Transfers                 General transfer comment: Unable to attempt due to worsening feeling/O2 levels with sit  Ambulation/Gait                 Stairs            Wheelchair Mobility    Modified Rankin (Stroke Patients Only)       Balance                                            Cognition Arousal/Alertness: Lethargic Behavior During Therapy: WFL  for tasks assessed/performed Overall Cognitive Status: Within Functional Limits for tasks assessed                                 General Comments: Nursing notified regarding O2 saturation and lethargy; Nursing instructs to place 1 L O2 ; improved to 89-92%      Exercises Other Exercises Other Exercises: sitting edge of bed tolerance with vitals check x 5 min. HR increases to 110 beats per minute and O2 saturation on 1 L decreases to 82%.     General Comments        Pertinent Vitals/Pain Pain Assessment: 0-10 Pain Score: 6  Pain Location: R groin Pain Descriptors / Indicators: Aching;Constant Pain Intervention(s): Monitored during session    Home Living                      Prior Function            PT Goals (current goals can now be found in the care plan section) Progress towards PT goals: Not progressing toward goals - comment    Frequency    BID  PT Plan Current plan remains appropriate    Co-evaluation              AM-PAC PT "6 Clicks" Daily Activity  Outcome Measure  Difficulty turning over in bed (including adjusting bedclothes, sheets and blankets)?: Unable Difficulty moving from lying on back to sitting on the side of the bed? : Unable Difficulty sitting down on and standing up from a chair with arms (e.g., wheelchair, bedside commode, etc,.)?: Unable Help needed moving to and from a bed to chair (including a wheelchair)?: A Lot Help needed walking in hospital room?: A Lot Help needed climbing 3-5 steps with a railing? : A Lot 6 Click Score: 9    End of Session   Activity Tolerance: Patient limited by lethargy;Patient limited by fatigue;Other (comment) (low O2 saturation)     PT Visit Diagnosis: Difficulty in walking, not elsewhere classified (R26.2);Pain Pain - Right/Left: Right Pain - part of body: Hip     Time: 1610-9604 PT Time Calculation (min) (ACUTE ONLY): 24 min  Charges:  $Therapeutic Exercise:  23-37 mins                    G Codes:        Scot Dock, PTA 05/28/2017, 4:36 PM

## 2017-05-28 NOTE — Progress Notes (Signed)
Physical Therapy Treatment Patient Details Name: James Finley MRN: [name] DOB: 12/21/1955 Today's Date: 05/28/2017    History of Present Illness James FailRickey Finley is a 61 yo male 1 Day Post-Op Procedure(s) (LRB):    PT Comments    Pt in bed ready for session.  Participated in exercises as described below.  To edge of bed with rails but no assist.  Stood with min guard/min assist and took self initiated time to stand before walking.  He was able to ambulate to PT gym with walker and min guard with verbal cues to stand fully and not to walk too far into walker.  Upon arrival in gym, he needed to sit and rest on mat before trying stairs.  Pt reported feeling "groggy" this morning but wanted to continue with stair training.  +2 assist for safety on stairs but he was able to go up/down with +1 min guard/assist with verbal cueing.  While on stairs, pt noted to be diaphoretic.  Returned to mat to rest.  BP 136/76  O2 91% on room air and P 119.  Chair was obtained but pt wanted to walk back to his room "I need to walk".  He was allowed to ambualte about 3340' but was stopped by writer to check HR which was 133.  He was instructed to sit in recliner and was given a ride back to his room in chair and he requested to return to bed to rest.  Min assist to get LE's onto bed.    Nursing on unit notified of HR/diaphoretic during gait this am.  Will continue as appropriate this pm and monitor HR response during session.   Follow Up Recommendations  Home health PT     Equipment Recommendations  Rolling walker with 5" wheels    Recommendations for Other Services       Precautions / Restrictions Precautions Precautions: Anterior Hip;Fall Restrictions Weight Bearing Restrictions: Yes RLE Weight Bearing: Weight bearing as tolerated    Mobility  Bed Mobility Overal bed mobility: Needs Assistance Bed Mobility: Supine to Sit;Sit to Supine     Supine to sit: Min guard;HOB elevated Sit to supine: Min  assist      Transfers Overall transfer level: Needs assistance Equipment used: Rolling walker (2 wheeled) Transfers: Sit to/from Stand Sit to Stand: Min assist;Min guard         General transfer comment: some hesitancy today  Ambulation/Gait Ambulation/Gait assistance: Min guard;Min assist Ambulation Distance (Feet): 100 Feet Assistive device: Rolling walker (2 wheeled) Gait Pattern/deviations: Step-through pattern;Trunk flexed     General Gait Details: verbal cues to stand fully upright and not to get too close to walker   Stairs Stairs: Yes   Stair Management: One rail Left;Two rails Number of Stairs: 4 General stair comments: verbal cues for sequencing  Wheelchair Mobility    Modified Rankin (Stroke Patients Only)       Balance Overall balance assessment: Needs assistance Sitting-balance support: Feet supported Sitting balance-Leahy Scale: Good     Standing balance support: Bilateral upper extremity supported Standing balance-Leahy Scale: Fair                              Cognition Arousal/Alertness: Awake/alert Behavior During Therapy: WFL for tasks assessed/performed Overall Cognitive Status: Within Functional Limits for tasks assessed  Exercises Total Joint Exercises Ankle Circles/Pumps: AROM;Strengthening;10 reps Heel Slides: AROM;Strengthening;Both;10 reps Hip ABduction/ADduction: AROM;Strengthening;Right;10 reps Straight Leg Raises: AAROM;Strengthening;Right;10 reps Long Arc Quad: AAROM;Strengthening;Right;10 reps    General Comments        Pertinent Vitals/Pain Pain Assessment: 0-10 Pain Score: 2  Pain Descriptors / Indicators: Aching;Sharp Pain Intervention(s): Limited activity within patient's tolerance;Monitored during session    Home Living                      Prior Function            PT Goals (current goals can now be found in the care plan  section) Progress towards PT goals: Progressing toward goals    Frequency    BID      PT Plan Current plan remains appropriate    Co-evaluation              AM-PAC PT "6 Clicks" Daily Activity  Outcome Measure  Difficulty turning over in bed (including adjusting bedclothes, sheets and blankets)?: A Little Difficulty moving from lying on back to sitting on the side of the bed? : A Little Difficulty sitting down on and standing up from a chair with arms (e.g., wheelchair, bedside commode, etc,.)?: A Little Help needed moving to and from a bed to chair (including a wheelchair)?: A Little Help needed walking in hospital room?: A Little Help needed climbing 3-5 steps with a railing? : A Little 6 Click Score: 18    End of Session Equipment Utilized During Treatment: Gait belt Activity Tolerance: Patient tolerated treatment well Patient left: in bed;with bed alarm set;with call bell/phone within reach;with nursing/sitter in room Nurse Communication: Other (comment) Pain - Right/Left: Right Pain - part of body: Hip     Time: 4098-1191 PT Time Calculation (min) (ACUTE ONLY): 32 min  Charges:  $Gait Training: 8-22 mins $Therapeutic Exercise: 8-22 mins                    G Codes:       Danielle Dess, PTA 05/28/17, 9:06 AM

## 2017-05-28 NOTE — Progress Notes (Signed)
   Subjective: 2 Days Post-Op Procedure(s) (LRB): TOTAL HIP ARTHROPLASTY ANTERIOR APPROACH (Right) Patient reports pain as mild.   Patient is well, and has had no acute complaints or problems Denies any CP, SOB, ABD pain. We will continue therapy today.  Plan is to go Home after hospital stay.  Objective: Vital signs in last 24 hours: Temp:  [99.4 F (37.4 C)-100 F (37.8 C)] 99.4 F (37.4 C) (09/13 0711) Pulse Rate:  [100-111] 100 (09/13 0711) Resp:  [17-18] 17 (09/13 0711) BP: (125-150)/(85-90) 132/85 (09/13 0711) SpO2:  [89 %-99 %] 96 % (09/13 0711)  Intake/Output from previous day: 09/12 0701 - 09/13 0700 In: 360 [P.O.:360] Out: 1225 [Urine:1225] Intake/Output this shift: No intake/output data recorded.   Recent Labs  05/26/17 2106 05/27/17 0447 05/28/17 0559  HGB 12.2* 12.3* 12.3*    Recent Labs  05/27/17 0447 05/28/17 0559  WBC 11.3* 12.7*  RBC 4.15* 4.21*  HCT 35.6* 36.3*  PLT 166 151    Recent Labs  05/27/17 0447 05/28/17 0559  NA 136 133*  K 3.7 3.9  CL 104 100*  CO2 26 28  BUN 10 13  CREATININE 0.60* 0.84  GLUCOSE 191* 187*  CALCIUM 8.1* 8.3*   No results for input(s): LABPT, INR in the last 72 hours.  EXAM General - Patient is Alert, Appropriate and Oriented Extremity - Neurovascular intact Sensation intact distally Intact pulses distally Dorsiflexion/Plantar flexion intact No cellulitis present Compartment soft Dressing - dressing C/D/I and scant drainage Motor Function - intact, moving foot and toes well on exam.   Past Medical History:  Diagnosis Date  . Arthritis   . Diabetes mellitus without complication (HCC)    diet controlled  . Hypercholesteremia unk  . Muscle cramps     Assessment/Plan:   2 Days Post-Op Procedure(s) (LRB): TOTAL HIP ARTHROPLASTY ANTERIOR APPROACH (Right) Active Problems:   Primary localized osteoarthritis of right hip  Estimated body mass index is 24.91 kg/m as calculated from the  following:   Height as of this encounter: 6\' 2"  (1.88 m).   Weight as of this encounter: 88 kg (194 lb). Advance diet Up with therapy  Discharge home today pending BM Follow up with [facility] in 2 weeks  DVT Prophylaxis - Lovenox, Foot Pumps and TED hose Weight-Bearing as tolerated to right leg   T. Cranston Neighborhris Sora Olivo, PA-C [facility] 05/28/2017, 8:27 AM

## 2017-05-29 ENCOUNTER — Inpatient Hospital Stay
Admission: RE | Admit: 2017-05-29 | Discharge: 2017-05-29 | Disposition: A | Discharge status: Home / Self Care | Payer: PRIVATE HEALTH INSURANCE | Source: Ambulatory Visit | Attending: Internal Medicine | Admitting: Internal Medicine

## 2017-05-29 ENCOUNTER — Encounter: Payer: Self-pay | Admitting: Nurse Practitioner

## 2017-05-29 DIAGNOSIS — E118 Type 2 diabetes mellitus with unspecified complications: Secondary | ICD-10-CM

## 2017-05-29 DIAGNOSIS — Z96641 Presence of right artificial hip joint: Secondary | ICD-10-CM

## 2017-05-29 DIAGNOSIS — R0902 Hypoxemia: Secondary | ICD-10-CM

## 2017-05-29 DIAGNOSIS — R0602 Shortness of breath: Secondary | ICD-10-CM

## 2017-05-29 LAB — GLUCOSE, CAPILLARY
GLUCOSE-CAPILLARY: 162 mg/dL — AB (ref 65–99)
GLUCOSE-CAPILLARY: 175 mg/dL — AB (ref 65–99)
GLUCOSE-CAPILLARY: 160 mg/dL — AB (ref 65–99)
Glucose-Capillary: 184 mg/dL — ABNORMAL HIGH (ref 65–99)

## 2017-05-29 LAB — SURGICAL PATHOLOGY

## 2017-05-29 MED ORDER — IPRATROPIUM-ALBUTEROL 0.5-2.5 (3) MG/3ML IN SOLN
3.0000 mL | Freq: Four times a day (QID) | RESPIRATORY_TRACT | Status: DC
  Administered 2017-05-29 – 2017-05-30 (×3): 3 mL via RESPIRATORY_TRACT
  Filled 2017-05-29 (×3): qty 3

## 2017-05-29 NOTE — Progress Notes (Signed)
Patient tolerated svn tx well. Have call for 2am tx if he is awake

## 2017-05-29 NOTE — Consult Note (Signed)
Cardiology Consult    Patient ID: James Finley MRN: [id number], DOB/AGE: [birth year]   Admit date: 05/26/2017 Date of Consult: 05/29/2017  Primary Physician: Lauro Regulus, MD Primary Cardiologist: New - seen by Concha Se, MD  Requesting Provider: Kathie Rhodes. Patel  Patient Profile    James Finley is a 61 y.o. male with a history of DM, HL, and OA, who is being seen today for the evaluation of mild troponin elevation and hypoxia at the request of Dr. Allena Katz.  Past Medical History   Past Medical History:  Diagnosis Date  . Arthritis    a. s/p L TKA, L THA, and R THA.  . Diabetes mellitus without complication (HCC)    diet controlled  . Hypercholesteremia unk  . Muscle cramps     Past Surgical History:  Procedure Laterality Date  . FRACTURE SURGERY Right    leg  . HERNIA REPAIR Bilateral    Inguinal Hernia Repair  . JOINT REPLACEMENT Left    hip  . KNEE CLOSED REDUCTION Left 09/06/2015   Procedure: CLOSED MANIPULATION KNEE;  Surgeon: Kennedy Bucker, MD;  Location: [facility];  Service: Orthopedics;  Laterality: Left;  . TOTAL HIP ARTHROPLASTY Right 05/26/2017   Procedure: TOTAL HIP ARTHROPLASTY ANTERIOR APPROACH;  Surgeon: Kennedy Bucker, MD;  Location: [facility];  Service: Orthopedics;  Laterality: Right;  . TOTAL KNEE ARTHROPLASTY Left 08/07/2015   Procedure: TOTAL KNEE ARTHROPLASTY;  Surgeon: Kennedy Bucker, MD;  Location: [facility];  Service: Orthopedics;  Laterality: Left;     Allergies  Allergies  Allergen Reactions  . Ibuprofen Other (See Comments)    Upsets stomach   . Percocet [Oxycodone-Acetaminophen] Nausea And Vomiting    History of Present Illness    61 y/o ? with a h/o DM, HL, and OA.  No prior cardiac hx.  He is previously active, working two jobs w/o limitations other than those created by worsening R hip pain.  He denies any prior h/o chest pain, dyspnea, pnd, orthopnea, syncope, palpitations, edema, or early satiety.  He was electively admitted 9/11  following R total hip arthroplasty.  On the afternoon of 9/13, he was noted to be hypoxic when working with PT.  O2 sat was 77% on RA.  He was placed on O2 @ 1lpm with improvement, but then desaturated again to 82% after sitting up on the edge of the bed.  He felt fatigued during this time.  HR was documented @ 100.  PT cancelled.  ECG showed sinus rhythm @ 94 bpm - no acute changes.  CTA chest neg for PE/ASD.  Atx noted.  No coronary Ca2+ reported.  Trop mildly elevated w/ flat trend @ 0.04  0.04  0.05.  We've been asked to eval.  No events on tele overnight.  He continues to deny chest pain or dyspnea.  Inpatient Medications    . docusate sodium  100 mg Oral BID  . enoxaparin (LOVENOX) injection  40 mg Subcutaneous Q24H  . insulin aspart  0-9 Units Subcutaneous TID WC  . metoprolol tartrate  12.5 mg Oral BID  . pravastatin  20 mg Oral QPM  . scopolamine  1 patch Transdermal Q72H  . vitamin C  500 mg Oral Daily    Family History    Family History  Problem Relation Age of Onset  . Hypertension Mother   . Hypertension Father     Social History    Social History   Social History  . Marital status: Married  Spouse name: N/A  . Number of children: N/A  . Years of education: N/A   Occupational History  . Not on file.   Social History Main Topics  . Smoking status: Former Smoker    Packs/day: 1.00    Types: Cigarettes    Quit date: 05/27/2007  . Smokeless tobacco: Never Used     Comment: smoked for 5-10 yrs prior to quitting in 2008.  Marland Kitchen Alcohol use No  . Drug use: No  . Sexual activity: Yes   Other Topics Concern  . Not on file   Social History Narrative   Lives in [place] with wife.  Works two jobs.     Review of Systems    General:  +++ fatigue. No chills, fever, night sweats or weight changes.  Cardiovascular:  No chest pain, dyspnea on exertion, edema, orthopnea, palpitations, paroxysmal nocturnal dyspnea. Dermatological: No rash,  lesions/masses Respiratory: No cough, dyspnea Urologic: No hematuria, dysuria Abdominal:   No nausea, vomiting, diarrhea, bright red blood per rectum, melena, or hematemesis Neurologic:  No visual changes, wkns, changes in mental status. MSK: R hip pain - improving. All other systems reviewed and are otherwise negative except as noted above.  Physical Exam    Blood pressure 131/80, pulse 87, temperature 98.8 F (37.1 C), temperature source Oral, resp. rate 18, height  (1.88 m), weight 194 lb (88 kg), SpO2 95 %.  General: Pleasant, NAD Psych: Normal affect. Neuro: Alert and oriented X 3. Moves all extremities spontaneously. HEENT: Normal  Neck: Supple without bruits or JVD. Lungs:  Resp regular and unlabored, CTA. Heart: RRR no s3, s4, or murmurs. Abdomen: Soft, non-tender, non-distended, BS + x 4.  Extremities: No clubbing, cyanosis or edema. DP/PT/Radials 2+ and equal bilaterally.  Labs     Recent Labs  05/28/17 0921 05/28/17 1511 05/28/17 2122  TROPONINI 0.04* 0.04* 0.05*   Lab Results  Component Value Date   WBC 12.7 (H) 05/28/2017   HGB 12.3 (L) 05/28/2017   HCT 36.3 (L) 05/28/2017   MCV 86.2 05/28/2017   PLT 151 05/28/2017    Recent Labs Lab 05/28/17 0559  NA 133*  K 3.9  CL 100*  CO2 28  BUN 13  CREATININE 0.84  CALCIUM 8.3*  GLUCOSE 187*    Radiology Studies    Ct Angio Chest Pe W Or Wo Contrast  Result Date: 05/28/2017 CLINICAL DATA:  Decreasing oxygen saturations after hip replacement surgery. EXAM: CT ANGIOGRAPHY CHEST WITH CONTRAST TECHNIQUE: Multidetector CT imaging of the chest was performed using the standard protocol during bolus administration of intravenous contrast. Multiplanar CT image reconstructions and MIPs were obtained to evaluate the vascular anatomy. CONTRAST:  75 cc Isovue 370 COMPARISON:  None. FINDINGS: Cardiovascular: Heart size upper normal. No pericardial effusion. No thoracic aortic aneurysm. No filling defect within the  opacified pulmonary arteries to suggest the presence of an acute pulmonary embolus. Mediastinum/Nodes: No mediastinal lymphadenopathy. There is no hilar lymphadenopathy. The esophagus has normal imaging features. There is no axillary lymphadenopathy. 2.4 cm right thyroid nodule. Lungs/Pleura: Subsegmental atelectasis noted both lower lungs. No pulmonary edema or pleural effusion. No focal airspace consolidation. Upper Abdomen: Unremarkable. Musculoskeletal: Bone windows reveal no worrisome lytic or sclerotic osseous lesions. Review of the MIP images confirms the above findings. IMPRESSION: 1. No CT evidence for acute pulmonary embolus. 2. Bibasilar atelectasis. 3. 2.4 cm right thyroid nodule. Thyroid ultrasound recommended to further evaluate. Electronically Signed   By: Kennith Center M.D.   On: 05/28/2017  09:57   Dg Hip Operative Unilat W Or W/o Pelvis Right  Result Date: 05/26/2017 CLINICAL DATA:  Right hip replacement EXAM: OPERATIVE RIGHT HIP WITH PELVIS COMPARISON:  None. FLUOROSCOPY TIME:  Radiation Exposure Index (as provided by the fluoroscopic device): Not available If the device does not provide the exposure index: Fluoroscopy Time:  12 seconds Number of Acquired Images:  3 FINDINGS: Initial image demonstrates significant degenerative changes in remodeling of the right femoral head. Right hip prosthesis is subsequently noted in satisfactory position. IMPRESSION: Status post right hip replacement Electronically Signed   By: Alcide Clever M.D.   On: 05/26/2017 20:14   Dg Hip Unilat W Or W/o Pelvis 2-3 Views Right  Result Date: 05/26/2017 CLINICAL DATA:  Status post right hip replacement EXAM: DG HIP (WITH OR WITHOUT PELVIS) 2-3V RIGHT COMPARISON:  None. FINDINGS: Right hip replacement is noted in satisfactory position. No acute bony or soft tissue abnormality is noted. IMPRESSION: Status post right hip replacement Electronically Signed   By: Alcide Clever M.D.   On: 05/26/2017 18:51    ECG & Cardiac  Imaging    RSR, 94, LAD, LAFB, LAE, LVH, inc RBBB - no acute changes.  Echo pending  Assessment & Plan    1.  Hypoxia:  Pt found to be hypoxic w/ O2 sat of 77% and resting HR of 100 prior to getting up w/ PT yesterday.  O2 sats improved with O2 1lpm but did drop to 82% and sitting up @ edge of bed.  Per notes, pt c/o fatigue/lethargy and R hip pain.  No c/p or dyspnea yesterday or at anytime in the past.  Trop mildly elevated @ 0.04  0.04  0.05.  CTA chest neg for PE and focal ASD.  Atx noted.  Pt subsequently placed no tele  sinus rhythm.  No arrhythmias.  ECG w/o acute changes.  Echo pending.  Encourage incentive spirometry.  Further recs pending echo.  2.  Elevated troponin: In setting of elevated hip surgery, hypoxia yesterday.  Minimal elevation w/ flat trend (0.04  0.04  0.05).  ECG non-acute.  Doubt ACS.  More likely non-specific vs demand ischemia.  Await echo.  If nl, may consider outpt stress testing following recovery from hip surgery.  3.  OA s/p R THA:  Per ortho.  4.  HL:  LDL 88 in June.  Cont prava.  5.  Diet controlled DM:  A1c 6.9 in June.  Followed by primary care.  Cont SSI.  Signed, Nicolasa Ducking, NP 05/29/2017, 10:49 AM

## 2017-05-29 NOTE — Progress Notes (Signed)
Notified by physical therapy that pts O2 SAT was  84% on room air. This nurse stepped in room O2 now 80 %. Nasal cannula 2L applied, pt up to 94%. MD Allena Katz notified. MD placing orders.

## 2017-05-29 NOTE — Progress Notes (Addendum)
   Subjective: 3 Days Post-Op Procedure(s) (LRB): TOTAL HIP ARTHROPLASTY ANTERIOR APPROACH (Right) Patient reports pain as mild.  CT chest yesterday was negative. VSS improved today.  Patient is well, and has had no acute complaints or problems Denies any cough, CP, SOB, ABD pain. We will continue therapy today.  Plan is to go Home after hospital stay.  Objective: Vital signs in last 24 hours: Temp:  [97.9 F (36.6 C)-98.9 F (37.2 C)] 98.8 F (37.1 C) (09/14 0745) Pulse Rate:  [87-133] 87 (09/14 0745) Resp:  [18-19] 18 (09/14 0745) BP: (126-139)/(74-84) 131/80 (09/14 0745) SpO2:  [77 %-96 %] 93 % (09/14 0745)  Intake/Output from previous day: 09/13 0701 - 09/14 0700 In: 120 [P.O.:120] Out: 1290 [Urine:1290] Intake/Output this shift: No intake/output data recorded.   Recent Labs  05/26/17 2106 05/27/17 0447 05/28/17 0559  HGB 12.2* 12.3* 12.3*    Recent Labs  05/27/17 0447 05/28/17 0559  WBC 11.3* 12.7*  RBC 4.15* 4.21*  HCT 35.6* 36.3*  PLT 166 151    Recent Labs  05/27/17 0447 05/28/17 0559  NA 136 133*  K 3.7 3.9  CL 104 100*  CO2 26 28  BUN 10 13  CREATININE 0.60* 0.84  GLUCOSE 191* 187*  CALCIUM 8.1* 8.3*   No results for input(s): LABPT, INR in the last 72 hours.  EXAM General - Patient is Alert, Appropriate and Oriented Extremity - Neurovascular intact Sensation intact distally Intact pulses distally Dorsiflexion/Plantar flexion intact No cellulitis present Compartment soft Dressing - dressing C/D/I and scant drainage Motor Function - intact, moving foot and toes well on exam.   Past Medical History:  Diagnosis Date  . Arthritis   . Diabetes mellitus without complication (HCC)    diet controlled  . Hypercholesteremia unk  . Muscle cramps     Assessment/Plan:   3 Days Post-Op Procedure(s) (LRB): TOTAL HIP ARTHROPLASTY ANTERIOR APPROACH (Right) Active Problems:   Primary localized osteoarthritis of right hip  Estimated body  mass index is 24.91 kg/m as calculated from the following:   Height as of this encounter:  (1.88 m).   Weight as of this encounter: 88 kg (194 lb). Advance diet Up with therapy  Appreciate internal medicine input.  Will continue to monitor patient VS today with PT Needs BM Encouraged incentive spirometer   DVT Prophylaxis - Lovenox, Foot Pumps and TED hose Weight-Bearing as tolerated to right leg   T. Cranston Neighbor, PA-C [facility] 05/29/2017, 8:20 AM

## 2017-05-29 NOTE — Progress Notes (Signed)
Physical Therapy Treatment Patient Details Name: Edin Kon MRN: [id number] DOB: [birth year] Today's Date: 05/29/2017    History of Present Illness Tibor Lemmons is a 61 yo male 1 Day Post-Op Procedure(s) (LRB):    PT Comments    Pt is making good progress towards goals. Pt performed supine there-ex, bed mobility/transfers with RW with min assist, amb with RW to door with CGA. Pt limited by O2 sat, between 81-85% on room air throughout session, 84% at end of session when back in bed. Discussed vitals with nurse. Pt did not report any SOB, dizziness or nausea. Pt appeared tired today, but motivated to participate in all PT activities. Reviewed written HEP packet. May attempt to amb around nurses's station this afternoon.     Follow Up Recommendations  Home health PT     Equipment Recommendations  Rolling walker with 5" wheels    Recommendations for Other Services       Precautions / Restrictions Precautions Precautions: Anterior Hip;Fall Precaution Booklet Issued: Yes (comment) Restrictions Weight Bearing Restrictions: Yes RLE Weight Bearing: Weight bearing as tolerated    Mobility  Bed Mobility Overal bed mobility: Needs Assistance Bed Mobility: Supine to Sit     Supine to sit: Min assist Sit to supine: Min assist   General bed mobility comments: Assist for RLE positioning. Monitored O2 sat, 85%.   Transfers Overall transfer level: Needs assistance Equipment used: Rolling walker (2 wheeled) Transfers: Sit to/from Stand Sit to Stand: Min assist         General transfer comment: Pt was leaning forward, cued to stand upright with gaze forward. No reported dizziness or nauseas. Monitored O2 sat, 83% with standing  Ambulation/Gait Ambulation/Gait assistance: Min guard Ambulation Distance (Feet): 70 Feet Assistive device: Rolling walker (2 wheeled) Gait Pattern/deviations: Step-through pattern     General Gait Details: Pt was using bathroom when I entered  room, able to walk to bed for supine ther-ex, then walked to door, returned to bed. No reported dizziness or nausea, O2 sat 81%   Stairs            Wheelchair Mobility    Modified Rankin (Stroke Patients Only)       Balance Overall balance assessment: Needs assistance Sitting-balance support: Feet supported Sitting balance-Leahy Scale: Good Sitting balance - Comments: Pt able to sit at EOB with CGA, while measuring O2 sat.    Standing balance support: Bilateral upper extremity supported Standing balance-Leahy Scale: Good Standing balance comment: Pt able to stand with RW with CGA, able to let go of one hand to measure O2 sat                            Cognition Arousal/Alertness: Awake/alert Behavior During Therapy: WFL for tasks assessed/performed Overall Cognitive Status: Within Functional Limits for tasks assessed                                        Exercises Other Exercises Other Exercises: Supine ther-ex performed 10x on RLE, ankle pumps, quad sets, hip abd/add, SLRs, heel slides. 10x spirometer breaths    General Comments        Pertinent Vitals/Pain Pain Assessment: No/denies pain    Home Living                      Prior Function  PT Goals (current goals can now be found in the care plan section) Acute Rehab PT Goals Patient Stated Goal: To return home PT Goal Formulation: With patient Time For Goal Achievement: 06/12/17 Potential to Achieve Goals: Good Progress towards PT goals: Progressing toward goals    Frequency    BID      PT Plan Current plan remains appropriate    Co-evaluation              AM-PAC PT "6 Clicks" Daily Activity  Outcome Measure  Difficulty turning over in bed (including adjusting bedclothes, sheets and blankets)?: Unable Difficulty moving from lying on back to sitting on the side of the bed? : Unable Difficulty sitting down on and standing up from a chair  with arms (e.g., wheelchair, bedside commode, etc,.)?: Unable Help needed moving to and from a bed to chair (including a wheelchair)?: A Little Help needed walking in hospital room?: A Little Help needed climbing 3-5 steps with a railing? : A Lot 6 Click Score: 11    End of Session Equipment Utilized During Treatment: Gait belt Activity Tolerance: Patient limited by fatigue (Limited by O2 sats, would not increase above 85% ) Patient left: in bed;with bed alarm set;with call bell/phone within reach Nurse Communication: Mobility status PT Visit Diagnosis: Other abnormalities of gait and mobility (R26.89);Muscle weakness (generalized) (M62.81);Difficulty in walking, not elsewhere classified (R26.2) Pain - Right/Left: Right Pain - part of body: Hip     Time: 1610-9604 PT Time Calculation (min) (ACUTE ONLY): 42 min  Charges:                       G Codes:  Functional Assessment Tool Used: AM-PAC 6 Clicks Basic Mobility Functional Limitation: Mobility: Walking and moving around Mobility: Walking and Moving Around Current Status (V4098): At least 60 percent but less than 80 percent impaired, limited or restricted Mobility: Walking and Moving Around Goal Status [phone]): At least 40 percent but less than 60 percent impaired, limited or restricted   Renford Dills, SPT  Renford Dills 05/29/2017, 11:16 AM

## 2017-05-29 NOTE — Progress Notes (Signed)
[facility]                                                                                                                                                                                  Patient Demographics   James Finley, is a 61 y.o. male, DOB - 03-13-1956, ZOX:[id number]  Admit date - 05/26/2017   Admitting Physician Kennedy Bucker, MD  Outpatient Primary MD for the patient is Lauro Regulus, MD   LOS - 3  Subjective: Patient was ambulated and his oxygen dropped. And he started having some tachycardia. CT scan of the chest yesterday was negative except for atelectasis    Review of Systems:   CONSTITUTIONAL: No documented fever. No fatigue, weakness. No weight gain, no weight loss.  EYES: No blurry or double vision.  ENT: No tinnitus. No postnasal drip. No redness of the oropharynx.  RESPIRATORY: No cough, no wheeze, no hemoptysis. No dyspnea.  CARDIOVASCULAR: No chest pain. No orthopnea. No palpitations. No syncope.  GASTROINTESTINAL: No nausea, no vomiting or diarrhea. No abdominal pain. No melena or hematochezia.  GENITOURINARY: No dysuria or hematuria.  ENDOCRINE: No polyuria or nocturia. No heat or cold intolerance.  HEMATOLOGY: No anemia. No bruising. No bleeding.  INTEGUMENTARY: No rashes. No lesions.  MUSCULOSKELETAL: No arthritis. No swelling. No gout.  NEUROLOGIC: No numbness, tingling, or ataxia. No seizure-type activity.  PSYCHIATRIC: No anxiety. No insomnia. No ADD.    Vitals:   Vitals:   05/29/17 0745 05/29/17 0949 05/29/17 0951 05/29/17 1217  BP: 131/80   117/74  Pulse: 87   83  Resp: 18   18  Temp: 98.8 F (37.1 C)   99 F (37.2 C)  TempSrc: Oral   Oral  SpO2: 93% (!) 80% 95% 95%  Weight:      Height:        Wt Readings from Last 3 Encounters:  05/26/17 194 lb (88 kg)  05/13/17 198 lb (89.8 kg)  11/02/15 180 lb (81.6 kg)     Intake/Output Summary (Last 24 hours) at 05/29/17 1234 Last data filed  at 05/29/17 1138  Gross per 24 hour  Intake              240 ml  Output             1315 ml  Net            -1075 ml    Physical Exam:   GENERAL: Pleasant-appearing in no apparent distress.  HEAD, EYES, EARS, NOSE AND THROAT: Atraumatic, normocephalic. Extraocular muscles are intact. Pupils equal and reactive to light. Sclerae anicteric. No conjunctival injection. No oro-pharyngeal erythema.  NECK: Supple. There is no jugular venous distention. No bruits, no lymphadenopathy, no thyromegaly.  HEART: Regular rate and rhythm,. No murmurs, no rubs, no clicks.  LUNGS: Clear to auscultation bilaterally. No rales or rhonchi. No wheezes.  ABDOMEN: Soft, flat, nontender, nondistended. Has good bowel sounds. No hepatosplenomegaly appreciated.  EXTREMITIES: No evidence of any cyanosis, clubbing, or peripheral edema.  +2 pedal and radial pulses bilaterally.  NEUROLOGIC: The patient is alert, awake, and oriented x3 with no focal motor or sensory deficits appreciated bilaterally.  SKIN: Moist and warm with no rashes appreciated.  Psych: Not anxious, depressed LN: No inguinal LN enlargement    Antibiotics   Anti-infectives    Start     Dose/Rate Route Frequency Ordered Stop   05/26/17 2300  ceFAZolin (ANCEF) IVPB 2g/100 mL premix     2 g 200 mL/hr over 30 Minutes Intravenous Every 6 hours 05/26/17 2032 05/27/17 1109   05/26/17 1237  ceFAZolin (ANCEF) 2-4 GM/100ML-% IVPB    Comments:  Agnes Lawrence  : cabinet override      05/26/17 1237 05/26/17 1645   05/25/17 2200  ceFAZolin (ANCEF) IVPB 2g/100 mL premix     2 g 200 mL/hr over 30 Minutes Intravenous  Once 05/25/17 2159 05/26/17 1720      Medications   Scheduled Meds: . docusate sodium  100 mg Oral BID  . enoxaparin (LOVENOX) injection  40 mg Subcutaneous Q24H  . insulin aspart  0-9 Units Subcutaneous TID WC  . ipratropium-albuterol  3 mL Nebulization Q6H  . metoprolol tartrate  12.5 mg Oral BID  . pravastatin  20 mg Oral QPM  .  scopolamine  1 patch Transdermal Q72H  . vitamin C  500 mg Oral Daily   Continuous Infusions: . methocarbamol (ROBAXIN)  IV     PRN Meds:.acetaminophen **OR** acetaminophen, alum & mag hydroxide-simeth, bisacodyl, diphenhydrAMINE, HYDROcodone-acetaminophen, magnesium citrate, magnesium hydroxide, magnesium oxide, menthol-cetylpyridinium **OR** phenol, methocarbamol **OR** methocarbamol (ROBAXIN)  IV, metoCLOPramide **OR** metoCLOPramide (REGLAN) injection, morphine injection, ondansetron **OR** ondansetron (ZOFRAN) IV, polyvinyl alcohol, zolpidem   Data Review:   Micro Results No results found for this or any previous visit (from the past 240 hour(s)).  Radiology Reports Ct Angio Chest Pe W Or Wo Contrast  Result Date: 05/28/2017 CLINICAL DATA:  Decreasing oxygen saturations after hip replacement surgery. EXAM: CT ANGIOGRAPHY CHEST WITH CONTRAST TECHNIQUE: Multidetector CT imaging of the chest was performed using the standard protocol during bolus administration of intravenous contrast. Multiplanar CT image reconstructions and MIPs were obtained to evaluate the vascular anatomy. CONTRAST:  75 cc Isovue 370 COMPARISON:  None. FINDINGS: Cardiovascular: Heart size upper normal. No pericardial effusion. No thoracic aortic aneurysm. No filling defect within the opacified pulmonary arteries to suggest the presence of an acute pulmonary embolus. Mediastinum/Nodes: No mediastinal lymphadenopathy. There is no hilar lymphadenopathy. The esophagus has normal imaging features. There is no axillary lymphadenopathy. 2.4 cm right thyroid nodule. Lungs/Pleura: Subsegmental atelectasis noted both lower lungs. No pulmonary edema or pleural effusion. No focal airspace consolidation. Upper Abdomen: Unremarkable. Musculoskeletal: Bone windows reveal no worrisome lytic or sclerotic osseous lesions. Review of the MIP images confirms the above findings. IMPRESSION: 1. No CT evidence for acute pulmonary embolus. 2.  Bibasilar atelectasis. 3. 2.4 cm right thyroid nodule. Thyroid ultrasound recommended to further evaluate. Electronically Signed   By: Kennith Center M.D.   On: 05/28/2017 09:57   Dg Hip Operative Unilat W Or W/o Pelvis Right  Result Date: 05/26/2017 CLINICAL DATA:  Right hip replacement  EXAM: OPERATIVE RIGHT HIP WITH PELVIS COMPARISON:  None. FLUOROSCOPY TIME:  Radiation Exposure Index (as provided by the fluoroscopic device): Not available If the device does not provide the exposure index: Fluoroscopy Time:  12 seconds Number of Acquired Images:  3 FINDINGS: Initial image demonstrates significant degenerative changes in remodeling of the right femoral head. Right hip prosthesis is subsequently noted in satisfactory position. IMPRESSION: Status post right hip replacement Electronically Signed   By: Alcide Clever M.D.   On: 05/26/2017 20:14   Dg Hip Unilat W Or W/o Pelvis 2-3 Views Right  Result Date: 05/26/2017 CLINICAL DATA:  Status post right hip replacement EXAM: DG HIP (WITH OR WITHOUT PELVIS) 2-3V RIGHT COMPARISON:  None. FINDINGS: Right hip replacement is noted in satisfactory position. No acute bony or soft tissue abnormality is noted. IMPRESSION: Status post right hip replacement Electronically Signed   By: Alcide Clever M.D.   On: 05/26/2017 18:51     CBC  Recent Labs Lab 05/26/17 2106 05/27/17 0447 05/28/17 0559  WBC 15.5* 11.3* 12.7*  HGB 12.2* 12.3* 12.3*  HCT 35.7* 35.6* 36.3*  PLT 178 166 151  MCV 85.6 85.7 86.2  MCH 29.2 29.5 29.2  MCHC 34.2 34.4 33.9  RDW 13.3 13.5 13.4    Chemistries   Recent Labs Lab 05/26/17 2106 05/27/17 0447 05/28/17 0559  NA  --  136 133*  K  --  3.7 3.9  CL  --  104 100*  CO2  --  26 28  GLUCOSE  --  191* 187*  BUN  --  10 13  CREATININE 0.61 0.60* 0.84  CALCIUM  --  8.1* 8.3*   ------------------------------------------------------------------------------------------------------------------ estimated creatinine clearance is 108.7  mL/min (by C-G formula based on SCr of 0.84 mg/dL). ------------------------------------------------------------------------------------------------------------------ No results for input(s): HGBA1C in the last 72 hours. ------------------------------------------------------------------------------------------------------------------ No results for input(s): CHOL, HDL, LDLCALC, TRIG, CHOLHDL, LDLDIRECT in the last 72 hours. ------------------------------------------------------------------------------------------------------------------  Recent Labs  05/28/17 0921  TSH 0.607   ------------------------------------------------------------------------------------------------------------------ No results for input(s): VITAMINB12, FOLATE, FERRITIN, TIBC, IRON, RETICCTPCT in the last 72 hours.  Coagulation profile No results for input(s): INR, PROTIME in the last 168 hours.  No results for input(s): DDIMER in the last 72 hours.  Cardiac Enzymes  Recent Labs Lab 05/28/17 0921 05/28/17 1511 05/28/17 2122  TROPONINI 0.04* 0.04* 0.05*   ------------------------------------------------------------------------------------------------------------------ Invalid input(s): POCBNP    Assessment & Plan   IMPRESSION AND PLAN: Patient is a 61 year old status post total hip arthroplasty now with diaphoresis tachycardia and hypoxia  1. Diaphoresis tachycardia and hypoxia CT scan of the chest associated atelectasis: continue incentive spirometer I will place patient on nebulizer therapy even though he is not wheezing I will obtain echocardiogram of the heart Cardiology consult  2. Diet control diabetes continue monitor blood sugar Continue ssi  3. Mild leukocytosis we'll see what the CT scan shows will obtain a urinalysis  4. Misc: Into new Lovenox for DVT prophylaxis     Code Status Orders        Start     Ordered   05/26/17 2033  Full code  Continuous     05/26/17 2032     Code Status History    Date Active Date Inactive Code Status Order ID Comments User Context   08/07/2015 10:44 AM 08/10/2015  7:38 PM Full Code [id number]  Kennedy Bucker, MD Inpatient           Consults cardiology  DVT Prophylaxis  Lovenox   Lab Results  Component Value Date   PLT 151 05/28/2017     Time Spent in minutes   Greater than 50% of time spent in care coordination and counseling patient regarding the condition and plan of care.   Auburn Bilberry M.D on 05/29/2017 at 12:34 PM  Between 7am to 6pm - Pager - [phone]  After 6pm go to [email or web address] - password [email or web address] [facility]  [facility]   Office  [phone]

## 2017-05-29 NOTE — Progress Notes (Signed)
Physical Therapy Treatment Patient Details Name: James Finley MRN: [id number] DOB: October 15, 1955 Today's Date: 05/29/2017    History of Present Illness 61 y/o male s/p R anterior hip replacement on 9/11.    PT Comments    Pt highly motivated to work hard with PT and walk as much as he could.  He was on 2 liters t/o the session and did struggle to consistently keep O2 above 90%, needed regular cuing and assist for breathing purposfully and with this is was generally able to increase sats to >92%.  Overall pt reported not having excessive fatigue and had a better session (regarding PT milestones) than any previous session, though again he is now requiring 2 liters O2 to remain >92%.  Follow Up Recommendations  Home health PT     Equipment Recommendations  Rolling walker with 5" wheels    Recommendations for Other Services       Precautions / Restrictions Precautions Precautions: Anterior Hip;Fall Precaution Booklet Issued: Yes (comment) Restrictions Weight Bearing Restrictions: Yes RLE Weight Bearing: Weight bearing as tolerated    Mobility  Bed Mobility Overal bed mobility: Modified Independent Bed Mobility: Supine to Sit     Supine to sit: Min guard Sit to supine: Min assist   General bed mobility comments: Pt was able to use UEs to assist R LE to EOB, able to get to sitting w/o direct physical assist  Transfers Overall transfer level: Modified independent Equipment used: Rolling walker (2 wheeled) Transfers: Sit to/from Stand Sit to Stand: Supervision         General transfer comment: Pt continues to have forward lean, able to maintain balance w/o heavy UE use  Ambulation/Gait Ambulation/Gait assistance: Min guard Ambulation Distance (Feet): 300 Feet Assistive device: Rolling walker (2 wheeled) Gait Pattern/deviations: Step-through pattern     General Gait Details: Pt on 2 liters O2 t/o ambulation.  His sats generally were 86-93%, needed regular cuing for  breathing strategies and to recover back to 90s.  Pt with step-to strategy with hesitancy during R stance phase, but safe and confident with no LOBs.  Overall pt did well amd was highly motivated to push himself.    Stairs Stairs: Yes   Stair Management: Two rails Number of Stairs: 4 General stair comments: Pt needed only minimal cuing, able to negotiate up/down steps w/o phyiscal assist  Wheelchair Mobility    Modified Rankin (Stroke Patients Only)       Balance Overall balance assessment: Modified Independent Sitting-balance support: Feet supported Sitting balance-Leahy Scale: Good Sitting balance - Comments: Pt able to sit at EOB with CGA, while measuring O2 sat.    Standing balance support: Bilateral upper extremity supported Standing balance-Leahy Scale: Good Standing balance comment: Pt able to stand with RW with CGA, able to let go of one hand to measure O2 sat                            Cognition Arousal/Alertness: Awake/alert Behavior During Therapy: WFL for tasks assessed/performed Overall Cognitive Status: Within Functional Limits for tasks assessed                                        Exercises Total Joint Exercises Ankle Circles/Pumps: AROM;10 reps Heel Slides: Strengthening;10 reps Hip ABduction/ADduction: Strengthening;10 reps Long Arc Quad: Strengthening;10 reps Marching in Standing: Seated;AROM;10 reps (pt struggles with R  against gravity hip flexion) Other Exercises Other Exercises: Supine ther-ex performed 10x on RLE, ankle pumps, quad sets, hip abd/add, SLRs, heel slides. 10x spirometer breaths    General Comments        Pertinent Vitals/Pain Pain Assessment: 0-10 Pain Score: 6  Pain Location: minimal R anterior hip pain at rest, 6/10 during ambulation and with hip flexion activities    Home Living                      Prior Function            PT Goals (current goals can now be found in the care  plan section) Acute Rehab PT Goals Patient Stated Goal: To return home PT Goal Formulation: With patient Time For Goal Achievement: 06/12/17 Potential to Achieve Goals: Good Progress towards PT goals: Progressing toward goals    Frequency    BID      PT Plan Current plan remains appropriate    Co-evaluation              AM-PAC PT "6 Clicks" Daily Activity  Outcome Measure  Difficulty turning over in bed (including adjusting bedclothes, sheets and blankets)?: A Little Difficulty moving from lying on back to sitting on the side of the bed? : A Little Difficulty sitting down on and standing up from a chair with arms (e.g., wheelchair, bedside commode, etc,.)?: A Little Help needed moving to and from a bed to chair (including a wheelchair)?: A Little Help needed walking in hospital room?: A Little Help needed climbing 3-5 steps with a railing? : A Little 6 Click Score: 18    End of Session Equipment Utilized During Treatment: Gait belt Activity Tolerance: Patient tolerated treatment well Patient left: with call bell/phone within reach;with chair alarm set Nurse Communication: Mobility status PT Visit Diagnosis: Other abnormalities of gait and mobility (R26.89);Muscle weakness (generalized) (M62.81);Difficulty in walking, not elsewhere classified (R26.2) Pain - Right/Left: Right Pain - part of body: Hip     Time: 1245-1317 PT Time Calculation (min) (ACUTE ONLY): 32 min  Charges:  $Gait Training: 8-22 mins $Therapeutic Exercise: 8-22 mins                    G Codes:  Functional Assessment Tool Used: AM-PAC 6 Clicks Basic Mobility Functional Limitation: Mobility: Walking and moving around Mobility: Walking and Moving Around Current Status ([id number]): At least 60 percent but less than 80 percent impaired, limited or restricted Mobility: Walking and Moving Around Goal Status [phone]): At least 40 percent but less than 60 percent impaired, limited or restricted    Malachi Pro, DPT 05/29/2017, 1:50 PM

## 2017-05-30 LAB — GLUCOSE, CAPILLARY
GLUCOSE-CAPILLARY: 193 mg/dL — AB (ref 65–99)
GLUCOSE-CAPILLARY: 174 mg/dL — AB (ref 65–99)

## 2017-05-30 LAB — CBC
HCT: 33.4 % — ABNORMAL LOW (ref 40.0–52.0)
Hemoglobin: 11.7 g/dL — ABNORMAL LOW (ref 13.0–18.0)
MCH: 30.4 pg (ref 26.0–34.0)
MCHC: 35 g/dL (ref 32.0–36.0)
MCV: 86.8 fL (ref 80.0–100.0)
PLATELETS: 165 10*3/uL (ref 150–440)
RBC: 3.84 MIL/uL — ABNORMAL LOW (ref 4.40–5.90)
RDW: 13.3 % (ref 11.5–14.5)
WBC: 8.4 10*3/uL (ref 3.8–10.6)

## 2017-05-30 LAB — BASIC METABOLIC PANEL
ANION GAP: 7 (ref 5–15)
BUN: 13 mg/dL (ref 6–20)
CALCIUM: 8.3 mg/dL — AB (ref 8.9–10.3)
CO2: 30 mmol/L (ref 22–32)
Chloride: 99 mmol/L — ABNORMAL LOW (ref 101–111)
Creatinine, Ser: 0.61 mg/dL (ref 0.61–1.24)
Glucose, Bld: 176 mg/dL — ABNORMAL HIGH (ref 65–99)
Potassium: 3.9 mmol/L (ref 3.5–5.1)
Sodium: 136 mmol/L (ref 135–145)

## 2017-05-30 LAB — ECHOCARDIOGRAM COMPLETE
HEIGHTINCHES: 74 in
WEIGHTICAEL: 3104 [oz_av]

## 2017-05-30 MED ORDER — METOPROLOL TARTRATE 25 MG PO TABS
25.0000 mg | ORAL_TABLET | Freq: Two times a day (BID) | ORAL | Status: DC
  Administered 2017-05-30: 25 mg via ORAL
  Filled 2017-05-30: qty 1

## 2017-05-30 MED ORDER — METOPROLOL TARTRATE 25 MG PO TABS: 25.0000 mg | ORAL_TABLET | Freq: Two times a day (BID) | ORAL | 0 refills | Status: DC

## 2017-05-30 NOTE — Progress Notes (Signed)
Echo result Normal EF, no valve disease Moderately elevated right heart pressures Likely contributing to SOB  If SOB continues this AM, consider giving lasix for diuresis He may benefit from follow up in cardiology clinic given likely acute diastolic CHF. Possibly exacerbated by IVF during surgical period.  Signed, Dossie Arbour, MD, Ph.D [facility]

## 2017-05-30 NOTE — Discharge Summary (Signed)
Physician Discharge Summary  Subjective: 4 Days Post-Op Procedure(s) (LRB): TOTAL HIP ARTHROPLASTY ANTERIOR APPROACH (Right) Patient reports pain as mild.   Patient seen in rounds with Dr. Ernest Pine. Patient is well, but has had some minor complaints of Shortness of breath with ambulation. He is on 2 L of nasal cannula which is making him feel better. Patient is ready to go home with home health physical therapy.  Physician Discharge Summary  Patient ID: James Finley MRN: [id number] DOB/AGE: [birth year] 61 y.o.  Admit date: 05/26/2017 Discharge date: 05/30/2017  Admission Diagnoses:  Discharge Diagnoses:  Active Problems:   Primary localized osteoarthritis of right hip   Discharged Condition: fair  Hospital Course: The patient is postop day 4 from a right anterior approach total hip replacement. His hip has done very well as he is ambulating 300 feet with physical therapy. He did have scant drainage with dressing changes. The patient had a drop in his oxygen saturation to 80% while ambulating on postoperative day 3. The patient has been on 2 L of nasal cannula with oxygen saturation at 92%. He was evaluated by cardiology and has had an echocardiogram on 05/29/2017. Results are pending. The patient will be going home after medical clearance.  Treatments: surgery:  TOTAL HIP ARTHROPLASTY ANTERIOR APPROACH (Right)  SURGEON: Leitha Schuller, MD  ASSISTANTS: none  ANESTHESIA:   spinal  EBL:  Total I/O In: 1200 [I.V.:1200] Out: 700 [Urine:200; Blood:500]  BLOOD ADMINISTERED:none  DRAINS: none   LOCAL MEDICATIONS USED:  MARCAINE     SPECIMEN:  Source of Specimen:  right femoral head  DISPOSITION OF SPECIMEN:  PATHOLOGY  COUNTS:  YES  TOURNIQUET:  * No tourniquets in log *  IMPLANTS: Medact Mpact DM 54 mm with liner.  DePuy Actis 8 standard stem with +5 mm ceramic head  Discharge Exam: Blood pressure 134/88, pulse (!) 101, temperature 100.3 F (37.9 C),  temperature source Oral, resp. rate 20, height  (1.88 m), weight 88 kg (194 lb), SpO2 92 %.   Disposition: 01-Home or Self Care   Allergies as of 05/30/2017      Reactions   Ibuprofen Other (See Comments)   Upsets stomach    Percocet [oxycodone-acetaminophen] Nausea And Vomiting      Medication List    STOP taking these medications   naproxen sodium 220 MG tablet Commonly known as:  ANAPROX     TAKE these medications   ARTIFICIAL TEARS OP Place 1 drop into both eyes 2 (two) times daily as needed (dry eyes).   b complex vitamins tablet Take 1 tablet by mouth daily.   docusate sodium 100 MG capsule Commonly known as:  COLACE Take 1 capsule (100 mg total) by mouth 2 (two) times daily.   enoxaparin 40 MG/0.4ML injection Commonly known as:  LOVENOX Inject 0.4 mLs (40 mg total) into the skin daily.   HYDROcodone-acetaminophen 7.5-325 MG tablet Commonly known as:  NORCO Take 1-2 tablets by mouth every 6 (six) hours as needed (breakthrough pain).   magnesium oxide 400 MG tablet Commonly known as:  MAG-OX Take 400 mg by mouth daily as needed (leg cramps).   meloxicam 7.5 MG tablet Commonly known as:  MOBIC Take 7.5 mg by mouth 2 (two) times daily as needed for pain.   methocarbamol 500 MG tablet Commonly known as:  ROBAXIN Take 1 tablet (500 mg total) by mouth every 6 (six) hours as needed for muscle spasms.   metoprolol tartrate 25 MG tablet Commonly known as:  LOPRESSOR Take 1 tablet (25 mg total) by mouth 2 (two) times daily.   pravastatin 20 MG tablet Commonly known as:  PRAVACHOL Take 20 mg by mouth every evening.   vitamin C 500 MG tablet Commonly known as:  ASCORBIC ACID Take 500 mg by mouth daily.            Durable Medical Equipment        Start     Ordered   05/26/17 2033  DME Walker rolling  Once    Question:  Patient needs a walker to treat with the following condition  Answer:  S/P total hip arthroplasty   05/26/17 2032   05/26/17  2033  DME 3 n 1  Once     05/26/17 2032   05/26/17 2033  DME Bedside commode  Once    Question:  Patient needs a bedside commode to treat with the following condition  Answer:  S/P total hip arthroplasty   05/26/17 2032       Discharge Care Instructions        Start     Ordered   05/30/17 0000  metoprolol tartrate (LOPRESSOR) 25 MG tablet  2 times daily     05/30/17 0755   05/28/17 0000  docusate sodium (COLACE) 100 MG capsule  2 times daily    Question:  Supervising Provider  Answer:  Kennedy Bucker   05/27/17 2154   05/28/17 0000  enoxaparin (LOVENOX) 40 MG/0.4ML injection  Every 24 hours    Question:  Supervising Provider  Answer:  Kennedy Bucker   05/27/17 2154   05/27/17 0000  methocarbamol (ROBAXIN) 500 MG tablet  Every 6 hours PRN    Question:  Supervising Provider  Answer:  Kennedy Bucker   05/27/17 2154   05/27/17 0000  HYDROcodone-acetaminophen (NORCO) 7.5-325 MG tablet  Every 6 hours PRN    Question:  Supervising Provider  Answer:  Kennedy Bucker   05/27/17 2154     Follow-up Information    Kennedy Bucker, MD. Go in 2 week(s).   Specialty:  Orthopedic Surgery Contact information: [address] [place] [address] [phone]           Signed: Lenard Forth, Robbert Langlinais 05/30/2017, 7:55 AM   Objective: Vital signs in last 24 hours: Temp:  [99 F (37.2 C)-100.3 F (37.9 C)] 100.3 F (37.9 C) (09/14 1935) Pulse Rate:  [83-101] 101 (09/14 1935) Resp:  [18-20] 20 (09/14 1935) BP: (117-134)/(74-88) 134/88 (09/14 1935) SpO2:  [80 %-95 %] 92 % (09/14 1935)  Intake/Output from previous day:  Intake/Output Summary (Last 24 hours) at 05/30/17 0755 Last data filed at 05/30/17 0445  Gross per 24 hour  Intake              480 ml  Output             1425 ml  Net             -945 ml    Intake/Output this shift: No intake/output data recorded.  Labs:  Recent Labs  05/28/17 0559 05/30/17 0404  HGB 12.3* 11.7*    Recent Labs   05/28/17 0559 05/30/17 0404  WBC 12.7* 8.4  RBC 4.21* 3.84*  HCT 36.3* 33.4*  PLT 151 165    Recent Labs  05/28/17 0559 05/30/17 0404  NA 133* 136  K 3.9 3.9  CL 100* 99*  CO2 28 30  BUN 13 13  CREATININE 0.84 0.61  GLUCOSE 187* 176*  CALCIUM 8.3* 8.3*   No results for input(s): LABPT, INR in the last 72 hours.  EXAM: General - Patient is Alert and Oriented Extremity - Neurovascular intact Dorsiflexion/Plantar flexion intact No cellulitis present Compartment soft Incision - clean, dry, scant drainage Motor Function -  plantar flexion and dorsiflexion is intact. He ambulates 300 feet with physical therapy.  Assessment/Plan: 4 Days Post-Op Procedure(s) (LRB): TOTAL HIP ARTHROPLASTY ANTERIOR APPROACH (Right) Procedure(s) (LRB): TOTAL HIP ARTHROPLASTY ANTERIOR APPROACH (Right) Past Medical History:  Diagnosis Date  . Arthritis    a. s/p L TKA, L THA, and R THA.  . Diabetes mellitus without complication (HCC)    diet controlled  . Hypercholesteremia unk  . Muscle cramps    Active Problems:   Primary localized osteoarthritis of right hip  Estimated body mass index is 24.91 kg/m as calculated from the following:   Height as of this encounter:  (1.88 m).   Weight as of this encounter: 88 kg (194 lb). Discharge home with home health when cleared by medicine. Diet - Regular diet Follow up - in 2 weeks Activity - WBAT Disposition - Home Condition Upon Discharge - Fair DVT Prophylaxis - Lovenox and TED hose  Dedra Skeens, PA-C Orthopaedic Surgery 05/30/2017, 7:55 AM

## 2017-05-30 NOTE — Progress Notes (Signed)
[facility] - [place] at [facility]   PATIENT NAME: James Finley    MR#:  [id number]  DATE OF BIRTH:  [birth year]  SUBJECTIVE:   Had episode of SVT  Sob better Going home today  REVIEW OF SYSTEMS:    Review of Systems  Constitutional: Negative for fever, chills weight loss HENT: Negative for ear pain, nosebleeds, congestion, facial swelling, rhinorrhea, neck pain, neck stiffness and ear discharge.   Respiratory: Negative for cough, shortness of breath, wheezing  Cardiovascular: Negative for chest pain, palpitations and leg swelling.  Gastrointestinal: Negative for heartburn, abdominal pain, vomiting, diarrhea or consitpation Genitourinary: Negative for dysuria, urgency, frequency, hematuria Musculoskeletal: Negative for back pain or joint pain Neurological: Negative for dizziness, seizures, syncope, focal weakness,  numbness and headaches.  Hematological: Does not bruise/bleed easily.  Psychiatric/Behavioral: Negative for hallucinations, confusion, dysphoric mood    Tolerating Diet: yes      DRUG ALLERGIES:   Allergies  Allergen Reactions  . Ibuprofen Other (See Comments)    Upsets stomach   . Percocet [Oxycodone-Acetaminophen] Nausea And Vomiting    VITALS:  Blood pressure 117/76, pulse 83, temperature 98.6 F (37 C), temperature source Oral, resp. rate 18, height  (1.88 m), weight 88 kg (194 lb), SpO2 93 %.  PHYSICAL EXAMINATION:  Constitutional: Appears well-developed and well-nourished. No distress. HENT: Normocephalic. Marland Kitchen Oropharynx is clear and moist.  Eyes: Conjunctivae and EOM are normal. PERRLA, no scleral icterus.  Neck: Normal ROM. Neck supple. No JVD. No tracheal deviation. CVS: RRR, S1/S2 +, no murmurs, no gallops, no carotid bruit.  Pulmonary: Effort and breath sounds normal, no stridor, rhonchi, wheezes, rales.  Abdominal: Soft. BS +,  no distension, tenderness, rebound or guarding.  Musculoskeletal: Normal range of motion.  No edema and no tenderness.  Neuro: Alert. CN 2-12 grossly intact. No focal deficits. Skin: Skin is warm and dry. No rash noted. Psychiatric: Normal mood and affect.      LABORATORY PANEL:   CBC  Recent Labs Lab 05/30/17 0404  WBC 8.4  HGB 11.7*  HCT 33.4*  PLT 165   ------------------------------------------------------------------------------------------------------------------  Chemistries   Recent Labs Lab 05/30/17 0404  NA 136  K 3.9  CL 99*  CO2 30  GLUCOSE 176*  BUN 13  CREATININE 0.61  CALCIUM 8.3*   ------------------------------------------------------------------------------------------------------------------  Cardiac Enzymes  Recent Labs Lab 05/28/17 0921 05/28/17 1511 05/28/17 2122  TROPONINI 0.04* 0.04* 0.05*   ------------------------------------------------------------------------------------------------------------------  RADIOLOGY:  No results found.   ASSESSMENT AND PLAN:   61 year old male postoperative day number for total hip arthroplasty headhypoxia and tachycardia.  1. Acute diastolic heart failure: Patient will need a follow-up with cardiology at the time discharge.  2. Acute hypoxic respiratory failure in the setting of CHF exacerbation and atelectasis. Resolving  3. Diet-controlled diabetes: Patient will follow. PCP  4. SVT: Resolved Increase metoprolol dose.      Management plans discussed with the patient and he is in agreement.  CODE STATUS: full  TOTAL TIME TAKING CARE OF THIS PATIENT: 27 minutes.     POSSIBLE D/C today, DEPENDING ON CLINICAL CONDITION.   James Finley M.D on 05/30/2017 at 11:48 AM  Between 7am to 6pm - Pager - [phone] After 6pm go to [email or web address] - Social research officer, government  [facility]  Office  [phone]  CC: Primary care physician; Lauro Regulus, MD  Note: This dictation was prepared with Dragon dictation along with smaller phrase technology. Any  transcriptional errors that result from this process  are unintentional.

## 2017-05-30 NOTE — Progress Notes (Signed)
Discharging home. Patient given instructions and prescriptions, verbalized understanding. Transported home via wife.

## 2017-05-30 NOTE — Progress Notes (Signed)
Physical Therapy Treatment Patient Details Name: James Finley MRN: [id number] DOB: 1955-11-17 Today's Date: 05/30/2017    History of Present Illness 61 y/o male s/p R anterior hip replacement on 9/11.    PT Comments    Participated in exercises as described below.  Pt able to stand and ambulate around nursing unit x 1 with walker and min guard.  Requires verbal cues at times for walker position. Decrease gait speed and to increase BOS. No LOB or buckling noted.   O2 at 2 lpm for gait.  HR and O2 monitored.  94% on 2 lpm P 78.  After gait P 115 and O2 85% on 2 lpm.  Increased to mid 90's with rest and breathing cues.    Follow Up Recommendations  Home health PT     Equipment Recommendations  Rolling walker with 5" wheels    Recommendations for Other Services       Precautions / Restrictions Precautions Precautions: Anterior Hip;Fall Restrictions Weight Bearing Restrictions: Yes RLE Weight Bearing: Weight bearing as tolerated    Mobility  Bed Mobility Overal bed mobility: Needs Assistance Bed Mobility: Supine to Sit     Supine to sit: Supervision        Transfers Overall transfer level: Needs assistance Equipment used: Rolling walker (2 wheeled)   Sit to Stand: Min guard         General transfer comment: Pt continues to have forward lean, able to maintain balance w/o heavy UE use  Ambulation/Gait Ambulation/Gait assistance: Min guard Ambulation Distance (Feet): 300 Feet Assistive device: Rolling walker (2 wheeled) Gait Pattern/deviations: Step-through pattern   Gait velocity interpretation: Below normal speed for age/gender     Stairs            Wheelchair Mobility    Modified Rankin (Stroke Patients Only)       Balance Overall balance assessment: Needs assistance Sitting-balance support: Feet supported Sitting balance-Leahy Scale: Good     Standing balance support: Bilateral upper extremity supported Standing balance-Leahy Scale:  Fair                              Cognition Arousal/Alertness: Awake/alert Behavior During Therapy: WFL for tasks assessed/performed Overall Cognitive Status: Within Functional Limits for tasks assessed                                        Exercises Total Joint Exercises Ankle Circles/Pumps: AROM;10 reps Quad Sets: AROM;Strengthening;10 reps Gluteal Sets: AROM;Strengthening;10 reps Heel Slides: Strengthening;10 reps Hip ABduction/ADduction: Strengthening;10 reps Straight Leg Raises: AAROM;Strengthening;Right;10 reps Long Arc Quad: Strengthening;10 reps    General Comments        Pertinent Vitals/Pain Pain Assessment: No/denies pain    Home Living                      Prior Function            PT Goals (current goals can now be found in the care plan section) Progress towards PT goals: Progressing toward goals    Frequency    BID      PT Plan Current plan remains appropriate    Co-evaluation              AM-PAC PT "6 Clicks" Daily Activity  Outcome Measure  Difficulty turning over in bed (including adjusting bedclothes,  sheets and blankets)?: A Little Difficulty moving from lying on back to sitting on the side of the bed? : A Little Difficulty sitting down on and standing up from a chair with arms (e.g., wheelchair, bedside commode, etc,.)?: A Little Help needed moving to and from a bed to chair (including a wheelchair)?: A Little Help needed walking in hospital room?: A Little Help needed climbing 3-5 steps with a railing? : A Little 6 Click Score: 18    End of Session Equipment Utilized During Treatment: Gait belt Activity Tolerance: Patient tolerated treatment well Patient left: with call bell/phone within reach;with chair alarm set;in chair   Pain - Right/Left: Right Pain - part of body: Hip     Time: 1610-9604 PT Time Calculation (min) (ACUTE ONLY): 28 min  Charges:  $Gait Training: 8-22  mins $Therapeutic Exercise: 8-22 mins                    G Codes:       Danielle Dess, PTA 05/30/17, 8:14 AM

## 2017-05-30 NOTE — Care Management Note (Signed)
Case Management Note  Patient Details  Name: James Finley MRN: [id number] Date of Birth: March 03, 1956  Subjective/Objective:    [name] has HH=PT called to [name] at [facility]. Has home DME at bedside delivered Friday. Lovenox called.   Action/Plan:   Expected Discharge Date:  05/30/17               Expected Discharge Plan:  Home w Home Health Services  In-House Referral:     Discharge planning Services  CM Consult  Post Acute Care Choice:  Home Health Choice offered to:  Patient, Spouse  DME Arranged:    DME Agency:     HH Arranged:  PT HH Agency:  [facility]  Status of Service:  In process, will continue to follow  If discussed at Long Length of Stay Meetings, dates discussed:    Additional Comments:  Tylia Ewell A, RN 05/30/2017, 9:11 AM

## 2017-05-30 NOTE — Progress Notes (Signed)
   Subjective: 4 Days Post-Op Procedure(s) (LRB): TOTAL HIP ARTHROPLASTY ANTERIOR APPROACH (Right) Patient reports pain as mild.  CT chest yesterday was negative. VSS improved today.  Patient is well, and has had no acute complaints or problems Denies any cough, CP, SOB, ABD pain. The patient has been dropping with his oxygen saturation when he is ambulating. His 92 on 2 L of nasal cannula. He is being evaluated by cardiology We will continue therapy today.  Plan is to go Home after hospital stay.  Objective: Vital signs in last 24 hours: Temp:  [99 F (37.2 C)-100.3 F (37.9 C)] 100.3 F (37.9 C) (09/14 1935) Pulse Rate:  [83-101] 101 (09/14 1935) Resp:  [18-20] 20 (09/14 1935) BP: (117-134)/(74-88) 134/88 (09/14 1935) SpO2:  [80 %-95 %] 92 % (09/14 1935)  Intake/Output from previous day: 09/14 0701 - 09/15 0700 In: 480 [P.O.:480] Out: 1425 [Urine:1425] Intake/Output this shift: No intake/output data recorded.   Recent Labs  05/28/17 0559 05/30/17 0404  HGB 12.3* 11.7*    Recent Labs  05/28/17 0559 05/30/17 0404  WBC 12.7* 8.4  RBC 4.21* 3.84*  HCT 36.3* 33.4*  PLT 151 165    Recent Labs  05/28/17 0559 05/30/17 0404  NA 133* 136  K 3.9 3.9  CL 100* 99*  CO2 28 30  BUN 13 13  CREATININE 0.84 0.61  GLUCOSE 187* 176*  CALCIUM 8.3* 8.3*   No results for input(s): LABPT, INR in the last 72 hours.  EXAM General - Patient is Alert, Appropriate and Oriented Extremity - Neurovascular intact Sensation intact distally Intact pulses distally Dorsiflexion/Plantar flexion intact No cellulitis present Compartment soft Dressing - dressing C/D/I and scant drainage Motor Function - intact, moving foot and toes well on exam. Ambulated 300 feet with physical therapy.  Past Medical History:  Diagnosis Date  . Arthritis    a. s/p L TKA, L THA, and R THA.  . Diabetes mellitus without complication (HCC)    diet controlled  . Hypercholesteremia unk  . Muscle  cramps     Assessment/Plan:   4 Days Post-Op Procedure(s) (LRB): TOTAL HIP ARTHROPLASTY ANTERIOR APPROACH (Right) Active Problems:   Primary localized osteoarthritis of right hip  Estimated body mass index is 24.91 kg/m as calculated from the following:   Height as of this encounter:  (1.88 m).   Weight as of this encounter: 88 kg (194 lb). Advance diet Up with therapy  Appreciate internal medicine input. Continue on 2 L of nasal cannula. Awaiting echocardiogram results. Awaiting medical clearance for discharge. We'll go home after cleared medically. Will continue to monitor patient VS today with PT Needs BM Encouraged incentive spirometer   DVT Prophylaxis - Lovenox, Foot Pumps and TED hose Weight-Bearing as tolerated to right leg   Dedra Skeens, PA-C [facility] 05/30/2017, 7:47 AM

## 2017-06-01 ENCOUNTER — Telehealth: Payer: Self-pay | Admitting: Cardiovascular Disease

## 2017-06-01 NOTE — Telephone Encounter (Signed)
Pt spouse calling stating pt was seen in hospital by Dr Mariah Milling  TCM.... They need a 2w fu appt   Please advise when we can schedule patient

## 2017-06-01 NOTE — Telephone Encounter (Signed)
Routing to scheduling. Dr Kirke Corin has new patient appointment slot tomorrow, please see if patient can come for that appointment.

## 2017-06-01 NOTE — Telephone Encounter (Signed)
Patient already scheduled for 10/5  No need to call

## 2017-06-01 NOTE — Telephone Encounter (Signed)
Patient contacted regarding discharge from [facility] on 06/01/17.  Patient understands to follow up with provider Ward Givens NP on 06/19/17 at 11:30AM at [facility]. Patient understands discharge instructions? Yes Patient understands medications and regiment? Yes Patient understands to bring all medications to this visit? Yes  Patients wife confirmed upcoming appointment and had no further questions at this time.

## 2017-06-01 NOTE — Telephone Encounter (Signed)
Just choose a hosp unassigned day and add him on.

## 2017-06-01 NOTE — Telephone Encounter (Signed)
This would be 9/25 at 3:30.  Does that work?

## 2017-06-01 NOTE — Telephone Encounter (Signed)
Left voicemail message to call back  

## 2017-06-03 ENCOUNTER — Encounter: Payer: Self-pay | Admitting: Orthopedic Surgery

## 2017-06-19 ENCOUNTER — Ambulatory Visit: Payer: PRIVATE HEALTH INSURANCE | Admitting: Nurse Practitioner

## 2017-07-10 ENCOUNTER — Ambulatory Visit (INDEPENDENT_AMBULATORY_CARE_PROVIDER_SITE_OTHER): Payer: PRIVATE HEALTH INSURANCE | Admitting: Nurse Practitioner

## 2017-07-10 ENCOUNTER — Encounter: Payer: Self-pay | Admitting: Nurse Practitioner

## 2017-07-10 VITALS — BP 130/84 | HR 57 | Ht 74.0 in | Wt 195.0 lb

## 2017-07-10 DIAGNOSIS — I5189 Other ill-defined heart diseases: Secondary | ICD-10-CM

## 2017-07-10 DIAGNOSIS — R748 Abnormal levels of other serum enzymes: Secondary | ICD-10-CM

## 2017-07-10 DIAGNOSIS — I519 Heart disease, unspecified: Secondary | ICD-10-CM | POA: Diagnosis not present

## 2017-07-10 DIAGNOSIS — R7989 Other specified abnormal findings of blood chemistry: Secondary | ICD-10-CM

## 2017-07-10 DIAGNOSIS — R778 Other specified abnormalities of plasma proteins: Secondary | ICD-10-CM

## 2017-07-10 NOTE — Patient Instructions (Signed)
Medication Instructions:  Your physician has recommended you make the following change in your medication:  1- STOP TAKING Metoprolol.   Labwork: none  Testing/Procedures: none  Follow-Up: Your physician recommends that you schedule a follow-up appointment in: as needed.

## 2017-07-10 NOTE — Progress Notes (Signed)
Office Visit    Patient Name: James Finley Date of Encounter: 07/10/2017  Primary Care Provider:  Lauro Regulus, MD Primary Cardiologist:  Concha Se, MD   Chief Complaint    61 y/o ? with a h/o DM, HL, and OA, who was recently admitted following right total hip arthroplasty and was noted to have hypoxia postoperatively with mild troponin elevation.  Past Medical History    Past Medical History:  Diagnosis Date  . Arthritis    a. s/p L TKA, L THA, and R THA.  . Diabetes mellitus without complication (HCC)    diet controlled  . Diastolic dysfunction    a. 06/2017 Echo: EF 55-60%, no rwma, Gr1 DD, nl RV fxn, PASP .  Marland Kitchen Hypercholesteremia unk  . Muscle cramps    Past Surgical History:  Procedure Laterality Date  . FRACTURE SURGERY Right    leg  . HERNIA REPAIR Bilateral    Inguinal Hernia Repair  . JOINT REPLACEMENT Left    hip  . KNEE CLOSED REDUCTION Left 09/06/2015   Procedure: CLOSED MANIPULATION KNEE;  Surgeon: Kennedy Bucker, MD;  Location: [facility] ORS;  Service: Orthopedics;  Laterality: Left;  . TOTAL HIP ARTHROPLASTY Right 05/26/2017   Procedure: TOTAL HIP ARTHROPLASTY ANTERIOR APPROACH;  Surgeon: Kennedy Bucker, MD;  Location: [facility] ORS;  Service: Orthopedics;  Laterality: Right;  . TOTAL KNEE ARTHROPLASTY Left 08/07/2015   Procedure: TOTAL KNEE ARTHROPLASTY;  Surgeon: Kennedy Bucker, MD;  Location: [facility] ORS;  Service: Orthopedics;  Laterality: Left;    Allergies  Allergies  Allergen Reactions  . Ibuprofen Other (See Comments)    Upsets stomach   . Percocet [Oxycodone-Acetaminophen] Nausea And Vomiting    History of Present Illness    61 year old ? with a prior history of diabetes, hyperlipidemia, and arthritis.  He has no prior cardiac history.  He has no prior history of chest pain or dyspnea and was working 2 jobs prior to having right total hip arthroplasty on September 11.  When working with physical therapy during hospital station, he was  noted to have an O2 sat of 77% on room air.  Troponins were checked and he was found to have a mild troponin elevation of 0.04  0.04  0.05.  He denied any symptoms.  Echo showed normal LV function with grade 1 diastolic dysfunction.  Moderately elevated right heart pressures were noted and may have been secondary to IV fluids postoperatively.  He was subsequently discharged on low-dose beta-blocker therapy.  Since his discharge, he has done well.  He has been receiving physical therapy but just finished.  He denies chest pain or dyspnea.  He is interested in coming off of beta-blocker therapy as he had no prior history of hypertension and prefers not to take it if not absolutely necessary.  He denies PND, orthopnea, palpitations, dizziness, syncope, edema, or early satiety.  Home Medications    Prior to Admission medications   Medication Sig Start Date End Date Taking? Authorizing Provider  b complex vitamins tablet Take 1 tablet by mouth daily.   Yes [provider]  docusate sodium (COLACE) 100 MG capsule Take 1 capsule (100 mg total) by mouth 2 (two) times daily. 05/28/17  Yes Evon Slack, PA-C  Hypromellose (ARTIFICIAL TEARS OP) Place 1 drop into both eyes 2 (two) times daily as needed (dry eyes).    Yes [provider]  magnesium oxide (MAG-OX) 400 MG tablet Take 400 mg by mouth daily as needed (leg cramps).  Yes [provider]  pravastatin (PRAVACHOL) 20 MG tablet Take 20 mg by mouth every evening.    Yes [provider]  vitamin C (ASCORBIC ACID) 500 MG tablet Take 500 mg by mouth daily.   Yes [provider]    Review of Systems    He denies chest pain, palpitations, dyspnea, pnd, orthopnea, n, v, dizziness, syncope, edema, weight gain, or early satiety.  All other systems reviewed and are otherwise negative except as noted above.  Physical Exam    VS:  BP 130/84 (BP Location: Right Arm, Patient Position: Sitting, Cuff Size: Normal)    Pulse (!) 57   Ht 6\' 2"  (1.88 m)   Wt 195 lb (88.5 kg)   BMI 25.04 kg/m  , BMI Body mass index is 25.04 kg/m. GEN: Well nourished, well developed, in no acute distress.  HEENT: normal.  Neck: Supple, no JVD, carotid bruits, or masses. Cardiac: RRR, no murmurs, rubs, or gallops. No clubbing, cyanosis, edema.  Radials/DP/PT 2+ and equal bilaterally.  Respiratory:  Respirations regular and unlabored, clear to auscultation bilaterally. GI: Soft, nontender, nondistended, BS + x 4. MS: no deformity or atrophy. Skin: warm and dry, no rash. Neuro:  Strength and sensation are intact. Psych: Normal affect.  Accessory Clinical Findings    ECG -sinus bradycardia, 57, left axis deviation, left anterior fascicular block, LVH  Assessment & Plan    1.  Diastolic dysfunction: Patient recently hospitalized following right total hip arthroplasty.  He had an episode of hypoxia with mild troponin elevation with a flat trend.  Echo showed normal LV function with elevated right heart pressures.  Since his discharge, he has done well without chest pain or dyspnea.  He has no prior past history of chest pain, dyspnea, or limitations with activities other than those imposed by right hip pain.  We discussed the role of stress testing in individuals with elevated troponins however, given normal echo and that he was asymptomatic before and since hospitalization, we will continue conservative management at this time.  I did recommend continuation of beta-blocker therapy in the setting of diastolic dysfunction and LVH noted on ECG.  Presumptively, he has a history of untreated hypertension though he is unaware of this.  He would like to come off of beta-blocker and follow his blood pressure at home.  He will contact us if blood pressure elevates off of therapy.  2.  Elevated troponin: Noted during recent hospitalization in September.  See #1.  3.  Disposition: Patient will contact us if blood pressure is elevated off of  beta-blocker therapy.  Otherwise, he will follow-up as needed.   Nicolasa Duckinghristopher Minnie Shi, NP 07/10/2017, 2:06 PM

## 2017-11-25 ENCOUNTER — Encounter: Payer: Self-pay | Admitting: Emergency Medicine

## 2017-11-25 ENCOUNTER — Emergency Department
Admission: EM | Admit: 2017-11-25 | Discharge: 2017-11-25 | Disposition: A | Discharge status: Home / Self Care | Payer: PRIVATE HEALTH INSURANCE | Attending: Emergency Medicine | Admitting: Emergency Medicine

## 2017-11-25 ENCOUNTER — Other Ambulatory Visit: Payer: Self-pay

## 2017-11-25 DIAGNOSIS — Y929 Unspecified place or not applicable: Secondary | ICD-10-CM | POA: Insufficient documentation

## 2017-11-25 DIAGNOSIS — E119 Type 2 diabetes mellitus without complications: Secondary | ICD-10-CM | POA: Insufficient documentation

## 2017-11-25 DIAGNOSIS — S0501XA Injury of conjunctiva and corneal abrasion without foreign body, right eye, initial encounter: Secondary | ICD-10-CM | POA: Diagnosis not present

## 2017-11-25 DIAGNOSIS — Y939 Activity, unspecified: Secondary | ICD-10-CM | POA: Diagnosis not present

## 2017-11-25 DIAGNOSIS — I5032 Chronic diastolic (congestive) heart failure: Secondary | ICD-10-CM | POA: Diagnosis not present

## 2017-11-25 DIAGNOSIS — H1031 Unspecified acute conjunctivitis, right eye: Secondary | ICD-10-CM | POA: Diagnosis not present

## 2017-11-25 DIAGNOSIS — Z87891 Personal history of nicotine dependence: Secondary | ICD-10-CM | POA: Diagnosis not present

## 2017-11-25 DIAGNOSIS — Z79899 Other long term (current) drug therapy: Secondary | ICD-10-CM | POA: Diagnosis not present

## 2017-11-25 DIAGNOSIS — X58XXXA Exposure to other specified factors, initial encounter: Secondary | ICD-10-CM | POA: Insufficient documentation

## 2017-11-25 DIAGNOSIS — Y999 Unspecified external cause status: Secondary | ICD-10-CM | POA: Insufficient documentation

## 2017-11-25 DIAGNOSIS — S0591XA Unspecified injury of right eye and orbit, initial encounter: Secondary | ICD-10-CM | POA: Diagnosis present

## 2017-11-25 MED ORDER — FLUORESCEIN SODIUM 1 MG OP STRP
1.0000 | ORAL_STRIP | Freq: Once | OPHTHALMIC | Status: AC
  Administered 2017-11-25: 1 via OPHTHALMIC
  Filled 2017-11-25: qty 1

## 2017-11-25 MED ORDER — HYDROCODONE-ACETAMINOPHEN 5-325 MG PO TABS: 1.0000 | ORAL_TABLET | Freq: Four times a day (QID) | ORAL | 0 refills | Status: AC | PRN

## 2017-11-25 MED ORDER — TETRACAINE HCL 0.5 % OP SOLN
1.0000 [drp] | Freq: Once | OPHTHALMIC | Status: AC
  Administered 2017-11-25: 2 [drp] via OPHTHALMIC
  Filled 2017-11-25: qty 4

## 2017-11-25 MED ORDER — CIPROFLOXACIN HCL 0.3 % OP SOLN
1.0000 [drp] | Freq: Once | OPHTHALMIC | Status: AC
  Administered 2017-11-25: 1 [drp] via OPHTHALMIC
  Filled 2017-11-25: qty 2.5

## 2017-11-25 MED ORDER — CIPROFLOXACIN HCL 0.3 % OP SOLN: 1.0000 [drp] | OPHTHALMIC | 0 refills | Status: AC

## 2017-11-25 NOTE — ED Provider Notes (Signed)
[facility] EMERGENCY DEPARTMENT Provider Note   CSN: [id number] Arrival date & time: 11/25/17  2152     History   Chief Complaint Chief Complaint  Patient presents with  . Eye Problem    HPI James Finley is a 63 y.o. male presents to the emergency department for evaluation of right drainage, redness foreign body sensation.  Patient states symptoms began this morning upon awakening when his eyelids were matted together with discharge.  He said right eye drainage throughout the day.  He denies any trauma or injury to the right eye.  He has not worked recently with any welding or metal.  Patient denies any vision changes.  He states he has had pain along the right eyelid for 4 years after being hit by a branch, this pain will come and go.  Is having some of this pain currently pain is mild.  He denies any photophobia, vision changes.  States he has a foreign body sensation with blinking along the upper eyelid.  No pain with extraocular eye movement.  No nausea or vomiting.  No temporal pain.  HPI  Past Medical History:  Diagnosis Date  . Arthritis    a. s/p L TKA, L THA, and R THA.  . Diabetes mellitus without complication (HCC)    diet controlled  . Diastolic dysfunction    a. 05/2017 Echo: EF 55-60%, no rwma, Gr1 DD, nl RV fxn, PASP .  Marland Kitchen Hypercholesteremia unk  . Muscle cramps     Patient Active Problem List   Diagnosis Date Noted  . Primary localized osteoarthritis of right hip 05/26/2017  . Primary osteoarthritis of left knee 08/07/2015    Past Surgical History:  Procedure Laterality Date  . FRACTURE SURGERY Right    leg  . HERNIA REPAIR Bilateral    Inguinal Hernia Repair  . JOINT REPLACEMENT Left    hip  . KNEE CLOSED REDUCTION Left 09/06/2015   Procedure: CLOSED MANIPULATION KNEE;  Surgeon: Kennedy Bucker, MD;  Location: [facility] ORS;  Service: Orthopedics;  Laterality: Left;  . TOTAL HIP ARTHROPLASTY Right 05/26/2017   Procedure: TOTAL HIP  ARTHROPLASTY ANTERIOR APPROACH;  Surgeon: Kennedy Bucker, MD;  Location: [facility] OR[facility];  Service: Orthopedics;  Laterality: Right;  . TOTAL KNEE ARTHROPLASTY Left 08/07/2015   Procedure: TOTAL KNEE ARTHROPLASTY;  Surgeon: Kennedy Bucker, MD;  Location: [facility] ORS;  Service: Orthopedics;  Laterality: Left;       Home Medications    Prior to Admission medications   Medication Sig Start Date End Date Taking? Authorizing Provider  b complex vitamins tablet Take 1 tablet by mouth daily.    [provider]  ciprofloxacin (CILOXAN) 0.3 % ophthalmic solution Place 1 drop into both eyes every 2 (two) hours for 5 days. Administer 1 drop, every 2 hours, while awake, for 2 days. Then 1 drop, every 4 hours, while awake, for the next 5 days. 11/25/17 11/30/17  Evon Slack, PA-C  docusate sodium (COLACE) 100 MG capsule Take 1 capsule (100 mg total) by mouth 2 (two) times daily. 05/28/17   Evon Slack, PA-C  HYDROcodone-acetaminophen (NORCO) 5-325 MG tablet Take 1 tablet by mouth every 6 (six) hours as needed for moderate pain. 11/25/17   Evon Slack, PA-C  Hypromellose (ARTIFICIAL TEARS OP) Place 1 drop into both eyes 2 (two) times daily as needed (dry eyes).     [provider]  magnesium oxide (MAG-OX) 400 MG tablet Take 400 mg by mouth daily as needed (  leg cramps).    [provider]  pravastatin (PRAVACHOL) 20 MG tablet Take 20 mg by mouth every evening.     [provider]  vitamin C (ASCORBIC ACID) 500 MG tablet Take 500 mg by mouth daily.    [provider]    Family History Family History  Problem Relation Age of Onset  . Hypertension Mother   . Hypertension Father     Social History Social History   Tobacco Use  . Smoking status: Former Smoker    Packs/day: 1.00    Types: Cigarettes    Last attempt to quit: 05/27/2007    Years since quitting: 10.5  . Smokeless tobacco: Never Used  . Tobacco comment: smoked for 5-10 yrs prior to quitting in  2008.  Substance Use Topics  . Alcohol use: No  . Drug use: No     Allergies   Ibuprofen and Percocet [oxycodone-acetaminophen]   Review of Systems Review of Systems  Constitutional: Negative for fever.  Eyes: Positive for discharge and redness. Negative for photophobia, pain, itching and visual disturbance.  Respiratory: Negative for shortness of breath.   Cardiovascular: Negative for chest pain.  Genitourinary: Negative for difficulty urinating, dysuria and urgency.  Musculoskeletal: Negative for back pain and myalgias.  Skin: Negative for rash.  Neurological: Negative for dizziness and headaches.     Physical Exam Updated Vital Signs BP (!) 149/93 (BP Location: Right Arm)   Pulse 63   Temp 97.6 F (36.4 C) (Oral)   Resp 16   Ht 6\' 2"  (1.88 m)   Wt 86.2 kg (190 lb)   SpO2 98%   BMI 24.39 kg/m   Physical Exam  Constitutional: He is oriented to person, place, and time. He appears well-developed and well-nourished.  HENT:  Head: Normocephalic and atraumatic.  Eyes: EOM and lids are normal. Pupils are equal, round, and reactive to light. Lids are everted and swept, no foreign bodies found. Right eye exhibits discharge. Right eye exhibits no exudate and no hordeolum. No foreign body present in the right eye. Left eye exhibits no discharge. Right conjunctiva is injected. Right conjunctiva has no hemorrhage. Left conjunctiva is not injected. Right eye exhibits normal extraocular motion. Left eye exhibits normal extraocular motion.  Slit lamp exam:      The right eye shows corneal abrasion (small area of uptake, linear 12 o'clock position upper cornea.) and fluorescein uptake. The right eye shows no corneal ulcer, no foreign body and no anterior chamber bulge.  Neck: Normal range of motion.  Cardiovascular: Normal rate and regular rhythm.  Pulmonary/Chest: Effort normal. No respiratory distress.  Neurological: He is alert and oriented to person, place, and time.     ED  Treatments / Results  Labs (all labs ordered are listed, but only abnormal results are displayed) Labs Reviewed - No data to display  EKG  EKG Interpretation None       Radiology No results found.  Procedures Procedures (including critical care time)  Medications Ordered in ED Medications  fluorescein ophthalmic strip 1 strip (1 strip Right Eye Given 11/25/17 2306)  tetracaine (PONTOCAINE) 0.5 % ophthalmic solution 1-2 drop (2 drops Right Eye Given 11/25/17 2307)  ciprofloxacin (CILOXAN) 0.3 % ophthalmic solution 1 drop (1 drop Right Eye Given 11/25/17 2306)     Initial Impression / Assessment and Plan / ED Course  I have reviewed the triage vital signs and the nursing notes.  Pertinent labs & imaging results that were available during my care  of the patient were reviewed by me and considered in my medical decision making (see chart for details).     62 year old male with 1 day history of foreign body sensation, eye drainage.  There is a very small area of floor seen uptake along the right mid upper cornea at 12 o'clock position with no visible foreign body throughout the right eye.  Patient describes waking up this morning with eyelids matted shut.  He denies any vision changes.  Will place patient on Ciloxan eyedrops and have patient follow-up with ophthalmologist.  He is educated on signs and symptoms return to ED for.  Final Clinical Impressions(s) / ED Diagnoses   Final diagnoses:  Acute conjunctivitis of right eye, unspecified acute conjunctivitis type  Abrasion of right cornea, initial encounter    ED Discharge Orders        Ordered    HYDROcodone-acetaminophen (NORCO) 5-325 MG tablet  Every 6 hours PRN     11/25/17 2343    ciprofloxacin (CILOXAN) 0.3 % ophthalmic solution  Every 2 hours     11/25/17 2343       Evon Slack, PA-C 11/25/17 2357    Minna Antis, MD 11/30/17 2312

## 2017-11-25 NOTE — ED Triage Notes (Signed)
Patient ambulatory to triage with steady gait, without difficulty or distress noted; pt reports right eye redness, pain, drainage; denies visual changes

## 2017-11-25 NOTE — Discharge Instructions (Signed)
Please take may please take pain medicine and antibiotic eyedrops as prescribed.  Call ophthalmologist in 1 day to schedule follow-up appointment.  Return to the ER for any worsening symptoms such as increasing eye pain, vision loss, worsening symptoms or urgent changes in your health.

## 2022-02-13 DIAGNOSIS — H401123 Primary open-angle glaucoma, left eye, severe stage: Secondary | ICD-10-CM | POA: Diagnosis not present

## 2022-02-13 DIAGNOSIS — H2513 Age-related nuclear cataract, bilateral: Secondary | ICD-10-CM | POA: Diagnosis not present

## 2022-02-13 DIAGNOSIS — H401112 Primary open-angle glaucoma, right eye, moderate stage: Secondary | ICD-10-CM | POA: Diagnosis not present

## 2022-02-19 DIAGNOSIS — I10 Essential (primary) hypertension: Secondary | ICD-10-CM | POA: Diagnosis not present

## 2022-02-19 DIAGNOSIS — M25572 Pain in left ankle and joints of left foot: Secondary | ICD-10-CM | POA: Diagnosis not present

## 2022-02-19 DIAGNOSIS — E785 Hyperlipidemia, unspecified: Secondary | ICD-10-CM | POA: Diagnosis not present

## 2022-02-19 DIAGNOSIS — E1169 Type 2 diabetes mellitus with other specified complication: Secondary | ICD-10-CM | POA: Diagnosis not present

## 2022-02-19 DIAGNOSIS — E118 Type 2 diabetes mellitus with unspecified complications: Secondary | ICD-10-CM | POA: Diagnosis not present

## 2022-02-28 DIAGNOSIS — Z0100 Encounter for examination of eyes and vision without abnormal findings: Secondary | ICD-10-CM | POA: Diagnosis not present

## 2022-02-28 DIAGNOSIS — H401112 Primary open-angle glaucoma, right eye, moderate stage: Secondary | ICD-10-CM | POA: Diagnosis not present

## 2022-03-07 DIAGNOSIS — H401112 Primary open-angle glaucoma, right eye, moderate stage: Secondary | ICD-10-CM | POA: Diagnosis not present

## 2022-03-31 DIAGNOSIS — H401112 Primary open-angle glaucoma, right eye, moderate stage: Secondary | ICD-10-CM | POA: Diagnosis not present

## 2022-06-30 DIAGNOSIS — H401112 Primary open-angle glaucoma, right eye, moderate stage: Secondary | ICD-10-CM | POA: Diagnosis not present

## 2022-08-25 DIAGNOSIS — Z0000 Encounter for general adult medical examination without abnormal findings: Secondary | ICD-10-CM | POA: Diagnosis not present

## 2022-08-25 DIAGNOSIS — E785 Hyperlipidemia, unspecified: Secondary | ICD-10-CM | POA: Diagnosis not present

## 2022-08-25 DIAGNOSIS — I10 Essential (primary) hypertension: Secondary | ICD-10-CM | POA: Diagnosis not present

## 2022-08-25 DIAGNOSIS — Z125 Encounter for screening for malignant neoplasm of prostate: Secondary | ICD-10-CM | POA: Diagnosis not present

## 2022-08-25 DIAGNOSIS — E1169 Type 2 diabetes mellitus with other specified complication: Secondary | ICD-10-CM | POA: Diagnosis not present

## 2022-08-26 DIAGNOSIS — I10 Essential (primary) hypertension: Secondary | ICD-10-CM | POA: Diagnosis not present

## 2022-08-26 DIAGNOSIS — E118 Type 2 diabetes mellitus with unspecified complications: Secondary | ICD-10-CM | POA: Diagnosis not present

## 2022-08-26 DIAGNOSIS — E785 Hyperlipidemia, unspecified: Secondary | ICD-10-CM | POA: Diagnosis not present

## 2022-08-26 DIAGNOSIS — E1169 Type 2 diabetes mellitus with other specified complication: Secondary | ICD-10-CM | POA: Diagnosis not present

## 2022-12-25 DIAGNOSIS — E785 Hyperlipidemia, unspecified: Secondary | ICD-10-CM | POA: Diagnosis not present

## 2022-12-25 DIAGNOSIS — I10 Essential (primary) hypertension: Secondary | ICD-10-CM | POA: Diagnosis not present

## 2022-12-25 DIAGNOSIS — E1169 Type 2 diabetes mellitus with other specified complication: Secondary | ICD-10-CM | POA: Diagnosis not present

## 2022-12-29 DIAGNOSIS — H401112 Primary open-angle glaucoma, right eye, moderate stage: Secondary | ICD-10-CM | POA: Diagnosis not present

## 2022-12-29 DIAGNOSIS — H2511 Age-related nuclear cataract, right eye: Secondary | ICD-10-CM | POA: Diagnosis not present

## 2022-12-29 DIAGNOSIS — H2513 Age-related nuclear cataract, bilateral: Secondary | ICD-10-CM | POA: Diagnosis not present

## 2022-12-29 DIAGNOSIS — H40039 Anatomical narrow angle, unspecified eye: Secondary | ICD-10-CM | POA: Diagnosis not present

## 2022-12-29 DIAGNOSIS — H2512 Age-related nuclear cataract, left eye: Secondary | ICD-10-CM | POA: Diagnosis not present

## 2023-05-08 DIAGNOSIS — Z125 Encounter for screening for malignant neoplasm of prostate: Secondary | ICD-10-CM | POA: Diagnosis not present

## 2023-05-08 DIAGNOSIS — E785 Hyperlipidemia, unspecified: Secondary | ICD-10-CM | POA: Diagnosis not present

## 2023-05-08 DIAGNOSIS — Z1331 Encounter for screening for depression: Secondary | ICD-10-CM | POA: Diagnosis not present

## 2023-05-08 DIAGNOSIS — Z0000 Encounter for general adult medical examination without abnormal findings: Secondary | ICD-10-CM | POA: Diagnosis not present

## 2023-05-08 DIAGNOSIS — E1169 Type 2 diabetes mellitus with other specified complication: Secondary | ICD-10-CM | POA: Diagnosis not present

## 2023-05-08 DIAGNOSIS — I10 Essential (primary) hypertension: Secondary | ICD-10-CM | POA: Diagnosis not present

## 2023-07-06 DIAGNOSIS — H2513 Age-related nuclear cataract, bilateral: Secondary | ICD-10-CM | POA: Diagnosis not present

## 2023-07-06 DIAGNOSIS — H40039 Anatomical narrow angle, unspecified eye: Secondary | ICD-10-CM | POA: Diagnosis not present

## 2023-07-06 DIAGNOSIS — H401123 Primary open-angle glaucoma, left eye, severe stage: Secondary | ICD-10-CM | POA: Diagnosis not present

## 2023-07-06 DIAGNOSIS — H401112 Primary open-angle glaucoma, right eye, moderate stage: Secondary | ICD-10-CM | POA: Diagnosis not present

## 2023-09-01 DIAGNOSIS — Z1211 Encounter for screening for malignant neoplasm of colon: Secondary | ICD-10-CM | POA: Diagnosis not present

## 2023-09-01 DIAGNOSIS — Z83719 Family history of colon polyps, unspecified: Secondary | ICD-10-CM | POA: Diagnosis not present

## 2023-10-09 ENCOUNTER — Ambulatory Visit: Payer: PRIVATE HEALTH INSURANCE

## 2023-10-09 DIAGNOSIS — Z1211 Encounter for screening for malignant neoplasm of colon: Secondary | ICD-10-CM | POA: Diagnosis not present

## 2023-10-09 DIAGNOSIS — Z83719 Family history of colon polyps, unspecified: Secondary | ICD-10-CM | POA: Diagnosis not present

## 2023-10-09 DIAGNOSIS — K5730 Diverticulosis of large intestine without perforation or abscess without bleeding: Secondary | ICD-10-CM | POA: Diagnosis not present

## 2023-11-10 DIAGNOSIS — E1169 Type 2 diabetes mellitus with other specified complication: Secondary | ICD-10-CM | POA: Diagnosis not present

## 2023-11-10 DIAGNOSIS — E785 Hyperlipidemia, unspecified: Secondary | ICD-10-CM | POA: Diagnosis not present

## 2023-11-10 DIAGNOSIS — I10 Essential (primary) hypertension: Secondary | ICD-10-CM | POA: Diagnosis not present

## 2024-01-06 DIAGNOSIS — H401123 Primary open-angle glaucoma, left eye, severe stage: Secondary | ICD-10-CM | POA: Diagnosis not present

## 2024-01-06 DIAGNOSIS — Z0100 Encounter for examination of eyes and vision without abnormal findings: Secondary | ICD-10-CM | POA: Diagnosis not present

## 2024-01-06 DIAGNOSIS — H2513 Age-related nuclear cataract, bilateral: Secondary | ICD-10-CM | POA: Diagnosis not present

## 2024-01-06 DIAGNOSIS — H401112 Primary open-angle glaucoma, right eye, moderate stage: Secondary | ICD-10-CM | POA: Diagnosis not present

## 2024-05-23 DIAGNOSIS — I10 Essential (primary) hypertension: Secondary | ICD-10-CM | POA: Diagnosis not present

## 2024-05-23 DIAGNOSIS — Z125 Encounter for screening for malignant neoplasm of prostate: Secondary | ICD-10-CM | POA: Diagnosis not present

## 2024-05-23 DIAGNOSIS — E118 Type 2 diabetes mellitus with unspecified complications: Secondary | ICD-10-CM | POA: Diagnosis not present

## 2024-05-23 DIAGNOSIS — E785 Hyperlipidemia, unspecified: Secondary | ICD-10-CM | POA: Diagnosis not present

## 2024-05-23 DIAGNOSIS — E1169 Type 2 diabetes mellitus with other specified complication: Secondary | ICD-10-CM | POA: Diagnosis not present

## 2024-05-31 ENCOUNTER — Ambulatory Visit
Admission: RE | Admit: 2024-05-31 | Discharge: 2024-05-31 | Disposition: A | Discharge status: Home / Self Care | Source: Ambulatory Visit | Attending: Internal Medicine | Admitting: Internal Medicine

## 2024-05-31 ENCOUNTER — Other Ambulatory Visit: Payer: Self-pay | Admitting: Internal Medicine

## 2024-05-31 DIAGNOSIS — Y92009 Unspecified place in unspecified non-institutional (private) residence as the place of occurrence of the external cause: Secondary | ICD-10-CM

## 2024-05-31 DIAGNOSIS — R519 Headache, unspecified: Secondary | ICD-10-CM | POA: Diagnosis not present

## 2024-05-31 DIAGNOSIS — E785 Hyperlipidemia, unspecified: Secondary | ICD-10-CM | POA: Diagnosis not present

## 2024-05-31 DIAGNOSIS — I10 Essential (primary) hypertension: Secondary | ICD-10-CM | POA: Diagnosis not present

## 2024-05-31 DIAGNOSIS — R350 Frequency of micturition: Secondary | ICD-10-CM | POA: Diagnosis not present

## 2024-05-31 DIAGNOSIS — Z0000 Encounter for general adult medical examination without abnormal findings: Secondary | ICD-10-CM | POA: Diagnosis not present

## 2024-05-31 DIAGNOSIS — E1169 Type 2 diabetes mellitus with other specified complication: Secondary | ICD-10-CM | POA: Diagnosis not present

## 2024-05-31 DIAGNOSIS — W19XXXA Unspecified fall, initial encounter: Secondary | ICD-10-CM | POA: Insufficient documentation

## 2024-05-31 DIAGNOSIS — I6782 Cerebral ischemia: Secondary | ICD-10-CM | POA: Diagnosis not present

## 2024-05-31 DIAGNOSIS — Z1331 Encounter for screening for depression: Secondary | ICD-10-CM | POA: Diagnosis not present

## 2024-07-06 DIAGNOSIS — H2513 Age-related nuclear cataract, bilateral: Secondary | ICD-10-CM | POA: Diagnosis not present

## 2024-07-06 DIAGNOSIS — H401123 Primary open-angle glaucoma, left eye, severe stage: Secondary | ICD-10-CM | POA: Diagnosis not present

## 2024-07-06 DIAGNOSIS — H401112 Primary open-angle glaucoma, right eye, moderate stage: Secondary | ICD-10-CM | POA: Diagnosis not present

## 2024-07-06 DIAGNOSIS — H40039 Anatomical narrow angle, unspecified eye: Secondary | ICD-10-CM | POA: Diagnosis not present
